# Patient Record
Sex: Male | Born: 1935
Health system: Southern US, Community
[De-identification: ages and names within clinical notes are randomized; demographics above are authoritative.]

## PROBLEM LIST (undated history)

## (undated) DIAGNOSIS — M545 Low back pain, unspecified: Secondary | ICD-10-CM

## (undated) DIAGNOSIS — Z8719 Personal history of other diseases of the digestive system: Secondary | ICD-10-CM

## (undated) DIAGNOSIS — G8929 Other chronic pain: Secondary | ICD-10-CM

## (undated) DIAGNOSIS — G309 Alzheimer's disease, unspecified: Secondary | ICD-10-CM

## (undated) DIAGNOSIS — M1990 Unspecified osteoarthritis, unspecified site: Secondary | ICD-10-CM

## (undated) DIAGNOSIS — F0390 Unspecified dementia without behavioral disturbance: Secondary | ICD-10-CM

## (undated) DIAGNOSIS — N400 Enlarged prostate without lower urinary tract symptoms: Secondary | ICD-10-CM

## (undated) DIAGNOSIS — E079 Disorder of thyroid, unspecified: Secondary | ICD-10-CM

## (undated) DIAGNOSIS — F0280 Dementia in other diseases classified elsewhere without behavioral disturbance: Secondary | ICD-10-CM

## (undated) DIAGNOSIS — N200 Calculus of kidney: Secondary | ICD-10-CM

## (undated) DIAGNOSIS — R55 Syncope and collapse: Secondary | ICD-10-CM

## (undated) DIAGNOSIS — I10 Essential (primary) hypertension: Secondary | ICD-10-CM

## (undated) DIAGNOSIS — H409 Unspecified glaucoma: Secondary | ICD-10-CM

## (undated) HISTORY — PX: LUMBAR DISC SURGERY: SHX700

## (undated) HISTORY — DX: Disorder of thyroid, unspecified: E07.9

## (undated) HISTORY — DX: Dementia in other diseases classified elsewhere, unspecified severity, without behavioral disturbance, psychotic disturbance, mood disturbance, and anxiety: F02.80

## (undated) HISTORY — DX: Alzheimer's disease, unspecified: G30.9

## (undated) HISTORY — PX: BACK SURGERY: SHX140

## (undated) HISTORY — PX: INGUINAL HERNIA REPAIR: SUR1180

## (undated) HISTORY — PX: CATARACT EXTRACTION W/ INTRAOCULAR LENS  IMPLANT, BILATERAL: SHX1307

## (undated) HISTORY — PX: HIP SURGERY: SHX245

## (undated) HISTORY — PX: COCHLEAR IMPLANT: SUR684

---
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
## 1959-03-03 DIAGNOSIS — Z8711 Personal history of peptic ulcer disease: Secondary | ICD-10-CM

## 1959-03-03 HISTORY — DX: Personal history of peptic ulcer disease: Z87.11

## 2007-04-23 ENCOUNTER — Ambulatory Visit (HOSPITAL_COMMUNITY): Admission: RE | Admit: 2007-04-23 | Discharge: 2007-04-23 | Payer: Self-pay | Admitting: Urology

## 2007-04-23 HISTORY — PX: EXTRACORPOREAL SHOCK WAVE LITHOTRIPSY: SHX1557

## 2007-04-24 ENCOUNTER — Ambulatory Visit (HOSPITAL_COMMUNITY): Admission: RE | Admit: 2007-04-24 | Discharge: 2007-04-24 | Payer: Self-pay | Admitting: Urology

## 2010-06-30 ENCOUNTER — Encounter
Admission: RE | Admit: 2010-06-30 | Discharge: 2010-06-30 | Payer: Self-pay | Source: Home / Self Care | Attending: Orthopedic Surgery | Admitting: Orthopedic Surgery

## 2010-11-14 NOTE — H&P (Signed)
David Valenzuela, David Valenzuela               ACCOUNT NO.:  [id number]   MEDICAL RECORD NO.:  [id number]          PATIENT TYPE:  AMB   LOCATION:  DAY                           FACILITY:  [facility]   PHYSICIAN:  Dennie Maizes, M.D.   DATE OF BIRTH:  16-May-1926   DATE OF ADMISSION:  DATE OF DISCHARGE:  LH                              HISTORY & PHYSICAL   He is coming to the short-stay center at [facility] on April 23, 2007.   CHIEF COMPLAINT:  Intermittent right lower quadrant abdominal pain,  right distal ureteral calculus with obstruction.   HISTORY OF PRESENT ILLNESS:  This 77 year old male who was evaluated for  intermittent right lower quadrant abdominal pain. CT scan of the abdomen  and pelvis revealed a 6-mm size right distal ureteral calculus with  obstruction and hydronephrosis. There was another 5-mm size  nonobstructing left renal calculus. The patient is unable to pass the  stone. He was brought to the short-stay center today for extracorporeal  shockwave lithotripsy of the right distal ureteral calculus.   The patient has benign prostatic hypertrophy with bladder neck  obstruction. He has been taking Flomax with good results. He has urinary  frequency x4 to 5 and nocturia x0. He has good urinary flow. He also has  a history of elevated PSA. Multiple prostate biopsies in the past  revealed no evidence of malignancy. The patient did not have any fevers,  chills, voiding difficulty or gross hematuria at present.   PAST MEDICAL HISTORY:  1. History of hypertension.  2. Elevated cholesterol.  3. Hypothyroidism.  4. Glaucoma.   MEDICATIONS:  Lipitor, Flomax, blood pressure medicine, aspirin and  Synthroid. Aspirin has been stopped for the lithotripsy.   ALLERGIES:  None.   EXAMINATION:  HEENT:  Normal.  NECK:  No masses.  LUNGS:  Clear to auscultation.  HEART:  Regular rate and rhythm. No murmurs.  ABDOMEN:  Soft. No palpable flank masses or CVA tenderness. Ventral  hernia is noted.  Bladder neck palpable. Penis and testes are normal.   IMPRESSION:  Right distal ureteral calculus with obstruction, right  lower quadrant abdominal pain, right hydronephrosis.   PLAN:  Extracorporeal shockwave lithotripsy with right distal ureteral  calculus with IV sedation in the short-stay center. I discussed with the  patient regarding the diagnosis, operative details, alternative  treatment, outcome, possible risks and complications. He has agreed for  the procedure to be done.      Dennie Maizes, M.D.  Electronically Signed     SK/MEDQ  D:  04/23/2007  T:  04/23/2007  Job:  [id number]   cc:   [facility]  Fax: [phone]

## 2011-04-11 LAB — POTASSIUM: Potassium: 4.4

## 2011-07-12 DIAGNOSIS — H905 Unspecified sensorineural hearing loss: Secondary | ICD-10-CM | POA: Diagnosis not present

## 2011-07-17 DIAGNOSIS — I10 Essential (primary) hypertension: Secondary | ICD-10-CM | POA: Diagnosis not present

## 2011-07-17 DIAGNOSIS — E7800 Pure hypercholesterolemia, unspecified: Secondary | ICD-10-CM | POA: Diagnosis not present

## 2011-07-17 DIAGNOSIS — E039 Hypothyroidism, unspecified: Secondary | ICD-10-CM | POA: Diagnosis not present

## 2011-07-18 DIAGNOSIS — H903 Sensorineural hearing loss, bilateral: Secondary | ICD-10-CM | POA: Diagnosis not present

## 2011-07-27 DIAGNOSIS — E039 Hypothyroidism, unspecified: Secondary | ICD-10-CM | POA: Diagnosis not present

## 2011-07-27 DIAGNOSIS — N400 Enlarged prostate without lower urinary tract symptoms: Secondary | ICD-10-CM | POA: Diagnosis not present

## 2011-07-27 DIAGNOSIS — R42 Dizziness and giddiness: Secondary | ICD-10-CM | POA: Diagnosis not present

## 2011-07-27 DIAGNOSIS — M1990 Unspecified osteoarthritis, unspecified site: Secondary | ICD-10-CM | POA: Diagnosis not present

## 2011-07-27 DIAGNOSIS — I10 Essential (primary) hypertension: Secondary | ICD-10-CM | POA: Diagnosis not present

## 2011-07-27 DIAGNOSIS — H913 Deaf nonspeaking, not elsewhere classified: Secondary | ICD-10-CM | POA: Diagnosis not present

## 2011-07-27 DIAGNOSIS — M6281 Muscle weakness (generalized): Secondary | ICD-10-CM | POA: Diagnosis not present

## 2011-07-27 DIAGNOSIS — E7800 Pure hypercholesterolemia, unspecified: Secondary | ICD-10-CM | POA: Diagnosis not present

## 2011-08-02 DIAGNOSIS — R269 Unspecified abnormalities of gait and mobility: Secondary | ICD-10-CM | POA: Diagnosis not present

## 2011-08-02 DIAGNOSIS — M6281 Muscle weakness (generalized): Secondary | ICD-10-CM | POA: Diagnosis not present

## 2011-08-02 DIAGNOSIS — IMO0001 Reserved for inherently not codable concepts without codable children: Secondary | ICD-10-CM | POA: Diagnosis not present

## 2011-08-07 DIAGNOSIS — M6281 Muscle weakness (generalized): Secondary | ICD-10-CM | POA: Diagnosis not present

## 2011-08-07 DIAGNOSIS — R269 Unspecified abnormalities of gait and mobility: Secondary | ICD-10-CM | POA: Diagnosis not present

## 2011-08-07 DIAGNOSIS — R42 Dizziness and giddiness: Secondary | ICD-10-CM | POA: Diagnosis not present

## 2011-08-07 DIAGNOSIS — IMO0001 Reserved for inherently not codable concepts without codable children: Secondary | ICD-10-CM | POA: Diagnosis not present

## 2011-08-10 DIAGNOSIS — M6281 Muscle weakness (generalized): Secondary | ICD-10-CM | POA: Diagnosis not present

## 2011-08-10 DIAGNOSIS — R42 Dizziness and giddiness: Secondary | ICD-10-CM | POA: Diagnosis not present

## 2011-08-10 DIAGNOSIS — IMO0001 Reserved for inherently not codable concepts without codable children: Secondary | ICD-10-CM | POA: Diagnosis not present

## 2011-08-10 DIAGNOSIS — R269 Unspecified abnormalities of gait and mobility: Secondary | ICD-10-CM | POA: Diagnosis not present

## 2011-08-14 DIAGNOSIS — M6281 Muscle weakness (generalized): Secondary | ICD-10-CM | POA: Diagnosis not present

## 2011-08-14 DIAGNOSIS — R269 Unspecified abnormalities of gait and mobility: Secondary | ICD-10-CM | POA: Diagnosis not present

## 2011-08-14 DIAGNOSIS — IMO0001 Reserved for inherently not codable concepts without codable children: Secondary | ICD-10-CM | POA: Diagnosis not present

## 2011-08-14 DIAGNOSIS — R42 Dizziness and giddiness: Secondary | ICD-10-CM | POA: Diagnosis not present

## 2011-08-17 DIAGNOSIS — IMO0001 Reserved for inherently not codable concepts without codable children: Secondary | ICD-10-CM | POA: Diagnosis not present

## 2011-08-17 DIAGNOSIS — M6281 Muscle weakness (generalized): Secondary | ICD-10-CM | POA: Diagnosis not present

## 2011-08-17 DIAGNOSIS — R42 Dizziness and giddiness: Secondary | ICD-10-CM | POA: Diagnosis not present

## 2011-08-17 DIAGNOSIS — R269 Unspecified abnormalities of gait and mobility: Secondary | ICD-10-CM | POA: Diagnosis not present

## 2011-08-21 DIAGNOSIS — R269 Unspecified abnormalities of gait and mobility: Secondary | ICD-10-CM | POA: Diagnosis not present

## 2011-08-21 DIAGNOSIS — R42 Dizziness and giddiness: Secondary | ICD-10-CM | POA: Diagnosis not present

## 2011-08-21 DIAGNOSIS — IMO0001 Reserved for inherently not codable concepts without codable children: Secondary | ICD-10-CM | POA: Diagnosis not present

## 2011-08-21 DIAGNOSIS — M6281 Muscle weakness (generalized): Secondary | ICD-10-CM | POA: Diagnosis not present

## 2011-08-22 DIAGNOSIS — H903 Sensorineural hearing loss, bilateral: Secondary | ICD-10-CM | POA: Diagnosis not present

## 2011-08-22 DIAGNOSIS — Z9689 Presence of other specified functional implants: Secondary | ICD-10-CM | POA: Diagnosis not present

## 2011-08-22 DIAGNOSIS — Z462 Encounter for fitting and adjustment of other devices related to nervous system and special senses: Secondary | ICD-10-CM | POA: Diagnosis not present

## 2011-08-23 DIAGNOSIS — M6281 Muscle weakness (generalized): Secondary | ICD-10-CM | POA: Diagnosis not present

## 2011-08-23 DIAGNOSIS — IMO0001 Reserved for inherently not codable concepts without codable children: Secondary | ICD-10-CM | POA: Diagnosis not present

## 2011-08-23 DIAGNOSIS — R42 Dizziness and giddiness: Secondary | ICD-10-CM | POA: Diagnosis not present

## 2011-08-23 DIAGNOSIS — R269 Unspecified abnormalities of gait and mobility: Secondary | ICD-10-CM | POA: Diagnosis not present

## 2011-08-27 DIAGNOSIS — M6281 Muscle weakness (generalized): Secondary | ICD-10-CM | POA: Diagnosis not present

## 2011-08-27 DIAGNOSIS — R42 Dizziness and giddiness: Secondary | ICD-10-CM | POA: Diagnosis not present

## 2011-08-27 DIAGNOSIS — IMO0001 Reserved for inherently not codable concepts without codable children: Secondary | ICD-10-CM | POA: Diagnosis not present

## 2011-08-27 DIAGNOSIS — R269 Unspecified abnormalities of gait and mobility: Secondary | ICD-10-CM | POA: Diagnosis not present

## 2011-09-06 DIAGNOSIS — H903 Sensorineural hearing loss, bilateral: Secondary | ICD-10-CM | POA: Diagnosis not present

## 2011-09-11 DIAGNOSIS — R42 Dizziness and giddiness: Secondary | ICD-10-CM | POA: Diagnosis not present

## 2011-09-11 DIAGNOSIS — I10 Essential (primary) hypertension: Secondary | ICD-10-CM | POA: Diagnosis not present

## 2011-09-21 DIAGNOSIS — H9190 Unspecified hearing loss, unspecified ear: Secondary | ICD-10-CM | POA: Diagnosis not present

## 2011-09-21 DIAGNOSIS — E785 Hyperlipidemia, unspecified: Secondary | ICD-10-CM | POA: Diagnosis not present

## 2011-09-21 DIAGNOSIS — I10 Essential (primary) hypertension: Secondary | ICD-10-CM | POA: Diagnosis not present

## 2011-09-21 DIAGNOSIS — E039 Hypothyroidism, unspecified: Secondary | ICD-10-CM | POA: Diagnosis not present

## 2011-10-02 ENCOUNTER — Other Ambulatory Visit: Payer: Self-pay | Admitting: Neurology

## 2011-10-02 DIAGNOSIS — R269 Unspecified abnormalities of gait and mobility: Secondary | ICD-10-CM

## 2011-10-02 DIAGNOSIS — H9190 Unspecified hearing loss, unspecified ear: Secondary | ICD-10-CM

## 2011-10-04 ENCOUNTER — Other Ambulatory Visit: Payer: Self-pay | Admitting: Neurology

## 2011-10-04 DIAGNOSIS — H40019 Open angle with borderline findings, low risk, unspecified eye: Secondary | ICD-10-CM | POA: Diagnosis not present

## 2011-10-04 DIAGNOSIS — H353190 Nonexudative age-related macular degeneration, unspecified eye, stage unspecified: Secondary | ICD-10-CM | POA: Diagnosis not present

## 2011-10-04 DIAGNOSIS — R269 Unspecified abnormalities of gait and mobility: Secondary | ICD-10-CM | POA: Diagnosis not present

## 2011-10-04 DIAGNOSIS — H9190 Unspecified hearing loss, unspecified ear: Secondary | ICD-10-CM | POA: Diagnosis not present

## 2011-10-04 LAB — CREATININE, SERUM: Creat: 1.4 mg/dL — ABNORMAL HIGH (ref 0.50–1.35)

## 2011-10-05 ENCOUNTER — Other Ambulatory Visit: Payer: Self-pay

## 2011-10-05 ENCOUNTER — Ambulatory Visit
Admission: RE | Admit: 2011-10-05 | Discharge: 2011-10-05 | Disposition: A | Discharge status: Home / Self Care | Payer: Medicare Other | Source: Ambulatory Visit | Attending: Neurology | Admitting: Neurology

## 2011-10-05 DIAGNOSIS — R269 Unspecified abnormalities of gait and mobility: Secondary | ICD-10-CM

## 2011-10-05 DIAGNOSIS — H9190 Unspecified hearing loss, unspecified ear: Secondary | ICD-10-CM

## 2011-10-05 DIAGNOSIS — I700 Atherosclerosis of aorta: Secondary | ICD-10-CM | POA: Diagnosis not present

## 2011-10-05 MED ORDER — IOHEXOL 350 MG/ML SOLN
100.0000 mL | Freq: Once | INTRAVENOUS | Status: AC | PRN
  Administered 2011-10-05: 100 mL via INTRAVENOUS

## 2011-10-10 DIAGNOSIS — H905 Unspecified sensorineural hearing loss: Secondary | ICD-10-CM | POA: Diagnosis not present

## 2011-10-31 DIAGNOSIS — K5909 Other constipation: Secondary | ICD-10-CM | POA: Diagnosis not present

## 2011-10-31 DIAGNOSIS — R42 Dizziness and giddiness: Secondary | ICD-10-CM | POA: Diagnosis not present

## 2011-11-01 DIAGNOSIS — E785 Hyperlipidemia, unspecified: Secondary | ICD-10-CM | POA: Diagnosis not present

## 2011-11-01 DIAGNOSIS — I10 Essential (primary) hypertension: Secondary | ICD-10-CM | POA: Diagnosis not present

## 2011-11-01 DIAGNOSIS — R269 Unspecified abnormalities of gait and mobility: Secondary | ICD-10-CM | POA: Diagnosis not present

## 2011-11-01 DIAGNOSIS — H9190 Unspecified hearing loss, unspecified ear: Secondary | ICD-10-CM | POA: Diagnosis not present

## 2011-11-13 DIAGNOSIS — E7800 Pure hypercholesterolemia, unspecified: Secondary | ICD-10-CM | POA: Diagnosis not present

## 2011-11-13 DIAGNOSIS — R269 Unspecified abnormalities of gait and mobility: Secondary | ICD-10-CM | POA: Diagnosis not present

## 2011-11-13 DIAGNOSIS — E785 Hyperlipidemia, unspecified: Secondary | ICD-10-CM | POA: Diagnosis not present

## 2011-11-13 DIAGNOSIS — I10 Essential (primary) hypertension: Secondary | ICD-10-CM | POA: Diagnosis not present

## 2011-11-13 DIAGNOSIS — IMO0001 Reserved for inherently not codable concepts without codable children: Secondary | ICD-10-CM | POA: Diagnosis not present

## 2011-11-13 DIAGNOSIS — R42 Dizziness and giddiness: Secondary | ICD-10-CM | POA: Diagnosis not present

## 2011-11-13 DIAGNOSIS — H9190 Unspecified hearing loss, unspecified ear: Secondary | ICD-10-CM | POA: Diagnosis not present

## 2011-11-13 DIAGNOSIS — E039 Hypothyroidism, unspecified: Secondary | ICD-10-CM | POA: Diagnosis not present

## 2011-11-15 DIAGNOSIS — H9190 Unspecified hearing loss, unspecified ear: Secondary | ICD-10-CM | POA: Diagnosis not present

## 2011-11-15 DIAGNOSIS — IMO0001 Reserved for inherently not codable concepts without codable children: Secondary | ICD-10-CM | POA: Diagnosis not present

## 2011-11-15 DIAGNOSIS — R269 Unspecified abnormalities of gait and mobility: Secondary | ICD-10-CM | POA: Diagnosis not present

## 2011-11-15 DIAGNOSIS — E785 Hyperlipidemia, unspecified: Secondary | ICD-10-CM | POA: Diagnosis not present

## 2011-11-15 DIAGNOSIS — E039 Hypothyroidism, unspecified: Secondary | ICD-10-CM | POA: Diagnosis not present

## 2011-11-15 DIAGNOSIS — I10 Essential (primary) hypertension: Secondary | ICD-10-CM | POA: Diagnosis not present

## 2011-11-19 DIAGNOSIS — I10 Essential (primary) hypertension: Secondary | ICD-10-CM | POA: Diagnosis not present

## 2011-11-19 DIAGNOSIS — E039 Hypothyroidism, unspecified: Secondary | ICD-10-CM | POA: Diagnosis not present

## 2011-11-19 DIAGNOSIS — E7800 Pure hypercholesterolemia, unspecified: Secondary | ICD-10-CM | POA: Diagnosis not present

## 2011-11-19 DIAGNOSIS — R42 Dizziness and giddiness: Secondary | ICD-10-CM | POA: Diagnosis not present

## 2011-11-20 DIAGNOSIS — R269 Unspecified abnormalities of gait and mobility: Secondary | ICD-10-CM | POA: Diagnosis not present

## 2011-11-20 DIAGNOSIS — IMO0001 Reserved for inherently not codable concepts without codable children: Secondary | ICD-10-CM | POA: Diagnosis not present

## 2011-11-20 DIAGNOSIS — H9190 Unspecified hearing loss, unspecified ear: Secondary | ICD-10-CM | POA: Diagnosis not present

## 2011-11-20 DIAGNOSIS — E785 Hyperlipidemia, unspecified: Secondary | ICD-10-CM | POA: Diagnosis not present

## 2011-11-20 DIAGNOSIS — E039 Hypothyroidism, unspecified: Secondary | ICD-10-CM | POA: Diagnosis not present

## 2011-11-20 DIAGNOSIS — I10 Essential (primary) hypertension: Secondary | ICD-10-CM | POA: Diagnosis not present

## 2011-11-22 DIAGNOSIS — IMO0001 Reserved for inherently not codable concepts without codable children: Secondary | ICD-10-CM | POA: Diagnosis not present

## 2011-11-22 DIAGNOSIS — H9190 Unspecified hearing loss, unspecified ear: Secondary | ICD-10-CM | POA: Diagnosis not present

## 2011-11-22 DIAGNOSIS — I10 Essential (primary) hypertension: Secondary | ICD-10-CM | POA: Diagnosis not present

## 2011-11-22 DIAGNOSIS — E039 Hypothyroidism, unspecified: Secondary | ICD-10-CM | POA: Diagnosis not present

## 2011-11-22 DIAGNOSIS — R269 Unspecified abnormalities of gait and mobility: Secondary | ICD-10-CM | POA: Diagnosis not present

## 2011-11-22 DIAGNOSIS — E785 Hyperlipidemia, unspecified: Secondary | ICD-10-CM | POA: Diagnosis not present

## 2011-11-23 DIAGNOSIS — H903 Sensorineural hearing loss, bilateral: Secondary | ICD-10-CM | POA: Diagnosis not present

## 2011-11-27 DIAGNOSIS — E785 Hyperlipidemia, unspecified: Secondary | ICD-10-CM | POA: Diagnosis not present

## 2011-11-27 DIAGNOSIS — IMO0001 Reserved for inherently not codable concepts without codable children: Secondary | ICD-10-CM | POA: Diagnosis not present

## 2011-11-27 DIAGNOSIS — E039 Hypothyroidism, unspecified: Secondary | ICD-10-CM | POA: Diagnosis not present

## 2011-11-27 DIAGNOSIS — R269 Unspecified abnormalities of gait and mobility: Secondary | ICD-10-CM | POA: Diagnosis not present

## 2011-11-27 DIAGNOSIS — H9190 Unspecified hearing loss, unspecified ear: Secondary | ICD-10-CM | POA: Diagnosis not present

## 2011-11-27 DIAGNOSIS — I10 Essential (primary) hypertension: Secondary | ICD-10-CM | POA: Diagnosis not present

## 2011-11-29 DIAGNOSIS — I10 Essential (primary) hypertension: Secondary | ICD-10-CM | POA: Diagnosis not present

## 2011-11-29 DIAGNOSIS — E039 Hypothyroidism, unspecified: Secondary | ICD-10-CM | POA: Diagnosis not present

## 2011-11-29 DIAGNOSIS — E785 Hyperlipidemia, unspecified: Secondary | ICD-10-CM | POA: Diagnosis not present

## 2011-11-29 DIAGNOSIS — R269 Unspecified abnormalities of gait and mobility: Secondary | ICD-10-CM | POA: Diagnosis not present

## 2011-11-29 DIAGNOSIS — IMO0001 Reserved for inherently not codable concepts without codable children: Secondary | ICD-10-CM | POA: Diagnosis not present

## 2011-11-29 DIAGNOSIS — H9190 Unspecified hearing loss, unspecified ear: Secondary | ICD-10-CM | POA: Diagnosis not present

## 2011-11-30 DIAGNOSIS — M25559 Pain in unspecified hip: Secondary | ICD-10-CM | POA: Diagnosis not present

## 2011-11-30 DIAGNOSIS — M169 Osteoarthritis of hip, unspecified: Secondary | ICD-10-CM | POA: Diagnosis not present

## 2011-12-03 DIAGNOSIS — R42 Dizziness and giddiness: Secondary | ICD-10-CM | POA: Diagnosis not present

## 2011-12-03 DIAGNOSIS — IMO0001 Reserved for inherently not codable concepts without codable children: Secondary | ICD-10-CM | POA: Diagnosis not present

## 2011-12-03 DIAGNOSIS — R269 Unspecified abnormalities of gait and mobility: Secondary | ICD-10-CM | POA: Diagnosis not present

## 2011-12-04 DIAGNOSIS — R972 Elevated prostate specific antigen [PSA]: Secondary | ICD-10-CM | POA: Diagnosis not present

## 2011-12-11 DIAGNOSIS — N400 Enlarged prostate without lower urinary tract symptoms: Secondary | ICD-10-CM | POA: Diagnosis not present

## 2011-12-12 DIAGNOSIS — Z462 Encounter for fitting and adjustment of other devices related to nervous system and special senses: Secondary | ICD-10-CM | POA: Diagnosis not present

## 2011-12-31 DIAGNOSIS — IMO0001 Reserved for inherently not codable concepts without codable children: Secondary | ICD-10-CM | POA: Diagnosis not present

## 2011-12-31 DIAGNOSIS — R269 Unspecified abnormalities of gait and mobility: Secondary | ICD-10-CM | POA: Diagnosis not present

## 2011-12-31 DIAGNOSIS — R42 Dizziness and giddiness: Secondary | ICD-10-CM | POA: Diagnosis not present

## 2012-01-08 DIAGNOSIS — R269 Unspecified abnormalities of gait and mobility: Secondary | ICD-10-CM | POA: Diagnosis not present

## 2012-01-08 DIAGNOSIS — IMO0001 Reserved for inherently not codable concepts without codable children: Secondary | ICD-10-CM | POA: Diagnosis not present

## 2012-01-08 DIAGNOSIS — R42 Dizziness and giddiness: Secondary | ICD-10-CM | POA: Diagnosis not present

## 2012-01-10 DIAGNOSIS — R42 Dizziness and giddiness: Secondary | ICD-10-CM | POA: Diagnosis not present

## 2012-01-10 DIAGNOSIS — R269 Unspecified abnormalities of gait and mobility: Secondary | ICD-10-CM | POA: Diagnosis not present

## 2012-01-10 DIAGNOSIS — IMO0001 Reserved for inherently not codable concepts without codable children: Secondary | ICD-10-CM | POA: Diagnosis not present

## 2012-01-24 DIAGNOSIS — M5430 Sciatica, unspecified side: Secondary | ICD-10-CM | POA: Diagnosis not present

## 2012-01-24 DIAGNOSIS — C61 Malignant neoplasm of prostate: Secondary | ICD-10-CM | POA: Diagnosis not present

## 2012-01-24 DIAGNOSIS — M1610 Unilateral primary osteoarthritis, unspecified hip: Secondary | ICD-10-CM | POA: Diagnosis not present

## 2012-02-05 DIAGNOSIS — R110 Nausea: Secondary | ICD-10-CM | POA: Diagnosis not present

## 2012-02-05 DIAGNOSIS — R1013 Epigastric pain: Secondary | ICD-10-CM | POA: Diagnosis not present

## 2012-02-05 DIAGNOSIS — R5381 Other malaise: Secondary | ICD-10-CM | POA: Diagnosis not present

## 2012-02-05 DIAGNOSIS — R634 Abnormal weight loss: Secondary | ICD-10-CM | POA: Diagnosis not present

## 2012-02-05 DIAGNOSIS — R05 Cough: Secondary | ICD-10-CM | POA: Diagnosis not present

## 2012-02-05 DIAGNOSIS — M129 Arthropathy, unspecified: Secondary | ICD-10-CM | POA: Diagnosis not present

## 2012-02-05 DIAGNOSIS — J309 Allergic rhinitis, unspecified: Secondary | ICD-10-CM | POA: Diagnosis not present

## 2012-02-05 DIAGNOSIS — R5383 Other fatigue: Secondary | ICD-10-CM | POA: Diagnosis not present

## 2012-02-18 DIAGNOSIS — H903 Sensorineural hearing loss, bilateral: Secondary | ICD-10-CM | POA: Diagnosis not present

## 2012-02-25 DIAGNOSIS — M169 Osteoarthritis of hip, unspecified: Secondary | ICD-10-CM | POA: Diagnosis not present

## 2012-02-25 DIAGNOSIS — M5430 Sciatica, unspecified side: Secondary | ICD-10-CM | POA: Diagnosis not present

## 2012-03-10 DIAGNOSIS — Z23 Encounter for immunization: Secondary | ICD-10-CM | POA: Diagnosis not present

## 2012-03-12 DIAGNOSIS — E039 Hypothyroidism, unspecified: Secondary | ICD-10-CM | POA: Diagnosis not present

## 2012-03-26 DIAGNOSIS — E7800 Pure hypercholesterolemia, unspecified: Secondary | ICD-10-CM | POA: Diagnosis not present

## 2012-03-26 DIAGNOSIS — I10 Essential (primary) hypertension: Secondary | ICD-10-CM | POA: Diagnosis not present

## 2012-03-26 DIAGNOSIS — E039 Hypothyroidism, unspecified: Secondary | ICD-10-CM | POA: Diagnosis not present

## 2012-03-27 DIAGNOSIS — H905 Unspecified sensorineural hearing loss: Secondary | ICD-10-CM | POA: Diagnosis not present

## 2012-04-01 DIAGNOSIS — N400 Enlarged prostate without lower urinary tract symptoms: Secondary | ICD-10-CM | POA: Diagnosis not present

## 2012-04-01 DIAGNOSIS — E039 Hypothyroidism, unspecified: Secondary | ICD-10-CM | POA: Diagnosis not present

## 2012-04-01 DIAGNOSIS — E7800 Pure hypercholesterolemia, unspecified: Secondary | ICD-10-CM | POA: Diagnosis not present

## 2012-04-01 DIAGNOSIS — G309 Alzheimer's disease, unspecified: Secondary | ICD-10-CM | POA: Diagnosis not present

## 2012-04-01 DIAGNOSIS — I10 Essential (primary) hypertension: Secondary | ICD-10-CM | POA: Diagnosis not present

## 2012-04-14 DIAGNOSIS — M48061 Spinal stenosis, lumbar region without neurogenic claudication: Secondary | ICD-10-CM | POA: Diagnosis not present

## 2012-04-14 DIAGNOSIS — M169 Osteoarthritis of hip, unspecified: Secondary | ICD-10-CM | POA: Diagnosis not present

## 2012-04-14 DIAGNOSIS — M47817 Spondylosis without myelopathy or radiculopathy, lumbosacral region: Secondary | ICD-10-CM | POA: Diagnosis not present

## 2012-04-14 DIAGNOSIS — M5137 Other intervertebral disc degeneration, lumbosacral region: Secondary | ICD-10-CM | POA: Diagnosis not present

## 2012-04-17 DIAGNOSIS — H905 Unspecified sensorineural hearing loss: Secondary | ICD-10-CM | POA: Diagnosis not present

## 2012-04-17 DIAGNOSIS — Z462 Encounter for fitting and adjustment of other devices related to nervous system and special senses: Secondary | ICD-10-CM | POA: Diagnosis not present

## 2012-04-17 DIAGNOSIS — Z9689 Presence of other specified functional implants: Secondary | ICD-10-CM | POA: Diagnosis not present

## 2012-04-29 DIAGNOSIS — Z7982 Long term (current) use of aspirin: Secondary | ICD-10-CM | POA: Diagnosis not present

## 2012-04-29 DIAGNOSIS — Z79899 Other long term (current) drug therapy: Secondary | ICD-10-CM | POA: Diagnosis not present

## 2012-04-29 DIAGNOSIS — I10 Essential (primary) hypertension: Secondary | ICD-10-CM | POA: Diagnosis not present

## 2012-04-29 DIAGNOSIS — M169 Osteoarthritis of hip, unspecified: Secondary | ICD-10-CM | POA: Diagnosis not present

## 2012-04-29 DIAGNOSIS — F0390 Unspecified dementia without behavioral disturbance: Secondary | ICD-10-CM | POA: Diagnosis not present

## 2012-04-29 DIAGNOSIS — M5137 Other intervertebral disc degeneration, lumbosacral region: Secondary | ICD-10-CM | POA: Diagnosis not present

## 2012-04-29 DIAGNOSIS — E039 Hypothyroidism, unspecified: Secondary | ICD-10-CM | POA: Diagnosis not present

## 2012-04-29 DIAGNOSIS — E785 Hyperlipidemia, unspecified: Secondary | ICD-10-CM | POA: Diagnosis not present

## 2012-04-29 DIAGNOSIS — N400 Enlarged prostate without lower urinary tract symptoms: Secondary | ICD-10-CM | POA: Diagnosis not present

## 2012-05-14 DIAGNOSIS — H903 Sensorineural hearing loss, bilateral: Secondary | ICD-10-CM | POA: Diagnosis not present

## 2012-05-20 DIAGNOSIS — M169 Osteoarthritis of hip, unspecified: Secondary | ICD-10-CM | POA: Diagnosis not present

## 2012-05-20 DIAGNOSIS — M48061 Spinal stenosis, lumbar region without neurogenic claudication: Secondary | ICD-10-CM | POA: Diagnosis not present

## 2012-05-20 DIAGNOSIS — M47817 Spondylosis without myelopathy or radiculopathy, lumbosacral region: Secondary | ICD-10-CM | POA: Diagnosis not present

## 2012-05-20 DIAGNOSIS — M5137 Other intervertebral disc degeneration, lumbosacral region: Secondary | ICD-10-CM | POA: Diagnosis not present

## 2012-05-22 DIAGNOSIS — Z7982 Long term (current) use of aspirin: Secondary | ICD-10-CM | POA: Diagnosis not present

## 2012-05-22 DIAGNOSIS — I10 Essential (primary) hypertension: Secondary | ICD-10-CM | POA: Diagnosis not present

## 2012-05-22 DIAGNOSIS — Z79899 Other long term (current) drug therapy: Secondary | ICD-10-CM | POA: Diagnosis not present

## 2012-05-22 DIAGNOSIS — M5137 Other intervertebral disc degeneration, lumbosacral region: Secondary | ICD-10-CM | POA: Diagnosis not present

## 2012-05-22 DIAGNOSIS — M169 Osteoarthritis of hip, unspecified: Secondary | ICD-10-CM | POA: Diagnosis not present

## 2012-05-22 DIAGNOSIS — N400 Enlarged prostate without lower urinary tract symptoms: Secondary | ICD-10-CM | POA: Diagnosis not present

## 2012-05-22 DIAGNOSIS — E785 Hyperlipidemia, unspecified: Secondary | ICD-10-CM | POA: Diagnosis not present

## 2012-05-22 DIAGNOSIS — E039 Hypothyroidism, unspecified: Secondary | ICD-10-CM | POA: Diagnosis not present

## 2012-05-22 DIAGNOSIS — M51379 Other intervertebral disc degeneration, lumbosacral region without mention of lumbar back pain or lower extremity pain: Secondary | ICD-10-CM | POA: Diagnosis not present

## 2012-05-22 DIAGNOSIS — F0390 Unspecified dementia without behavioral disturbance: Secondary | ICD-10-CM | POA: Diagnosis not present

## 2012-05-22 DIAGNOSIS — M47817 Spondylosis without myelopathy or radiculopathy, lumbosacral region: Secondary | ICD-10-CM | POA: Diagnosis not present

## 2012-05-23 DIAGNOSIS — M47817 Spondylosis without myelopathy or radiculopathy, lumbosacral region: Secondary | ICD-10-CM | POA: Diagnosis not present

## 2012-05-23 DIAGNOSIS — M48061 Spinal stenosis, lumbar region without neurogenic claudication: Secondary | ICD-10-CM | POA: Diagnosis not present

## 2012-05-23 DIAGNOSIS — M5137 Other intervertebral disc degeneration, lumbosacral region: Secondary | ICD-10-CM | POA: Diagnosis not present

## 2012-06-04 DIAGNOSIS — R972 Elevated prostate specific antigen [PSA]: Secondary | ICD-10-CM | POA: Diagnosis not present

## 2012-06-11 DIAGNOSIS — N400 Enlarged prostate without lower urinary tract symptoms: Secondary | ICD-10-CM | POA: Diagnosis not present

## 2012-06-26 DIAGNOSIS — M5380 Other specified dorsopathies, site unspecified: Secondary | ICD-10-CM | POA: Diagnosis not present

## 2012-06-26 DIAGNOSIS — M5137 Other intervertebral disc degeneration, lumbosacral region: Secondary | ICD-10-CM | POA: Diagnosis not present

## 2012-06-26 DIAGNOSIS — M48061 Spinal stenosis, lumbar region without neurogenic claudication: Secondary | ICD-10-CM | POA: Diagnosis not present

## 2012-06-26 DIAGNOSIS — M47817 Spondylosis without myelopathy or radiculopathy, lumbosacral region: Secondary | ICD-10-CM | POA: Diagnosis not present

## 2012-07-08 DIAGNOSIS — I10 Essential (primary) hypertension: Secondary | ICD-10-CM | POA: Diagnosis not present

## 2012-07-08 DIAGNOSIS — E7800 Pure hypercholesterolemia, unspecified: Secondary | ICD-10-CM | POA: Diagnosis not present

## 2012-07-14 DIAGNOSIS — E039 Hypothyroidism, unspecified: Secondary | ICD-10-CM | POA: Diagnosis not present

## 2012-07-14 DIAGNOSIS — M1990 Unspecified osteoarthritis, unspecified site: Secondary | ICD-10-CM | POA: Diagnosis not present

## 2012-07-14 DIAGNOSIS — N400 Enlarged prostate without lower urinary tract symptoms: Secondary | ICD-10-CM | POA: Diagnosis not present

## 2012-07-14 DIAGNOSIS — H913 Deaf nonspeaking, not elsewhere classified: Secondary | ICD-10-CM | POA: Diagnosis not present

## 2012-07-14 DIAGNOSIS — E7800 Pure hypercholesterolemia, unspecified: Secondary | ICD-10-CM | POA: Diagnosis not present

## 2012-07-14 DIAGNOSIS — I10 Essential (primary) hypertension: Secondary | ICD-10-CM | POA: Diagnosis not present

## 2012-07-14 DIAGNOSIS — R42 Dizziness and giddiness: Secondary | ICD-10-CM | POA: Diagnosis not present

## 2012-07-18 DIAGNOSIS — M169 Osteoarthritis of hip, unspecified: Secondary | ICD-10-CM | POA: Diagnosis not present

## 2012-07-18 DIAGNOSIS — E039 Hypothyroidism, unspecified: Secondary | ICD-10-CM | POA: Diagnosis not present

## 2012-07-18 DIAGNOSIS — M5380 Other specified dorsopathies, site unspecified: Secondary | ICD-10-CM | POA: Diagnosis not present

## 2012-07-18 DIAGNOSIS — I10 Essential (primary) hypertension: Secondary | ICD-10-CM | POA: Diagnosis not present

## 2012-07-18 DIAGNOSIS — N400 Enlarged prostate without lower urinary tract symptoms: Secondary | ICD-10-CM | POA: Diagnosis not present

## 2012-07-18 DIAGNOSIS — F0390 Unspecified dementia without behavioral disturbance: Secondary | ICD-10-CM | POA: Diagnosis not present

## 2012-07-18 DIAGNOSIS — M5137 Other intervertebral disc degeneration, lumbosacral region: Secondary | ICD-10-CM | POA: Diagnosis not present

## 2012-07-18 DIAGNOSIS — E785 Hyperlipidemia, unspecified: Secondary | ICD-10-CM | POA: Diagnosis not present

## 2012-07-28 DIAGNOSIS — M47817 Spondylosis without myelopathy or radiculopathy, lumbosacral region: Secondary | ICD-10-CM | POA: Diagnosis not present

## 2012-07-28 DIAGNOSIS — M5137 Other intervertebral disc degeneration, lumbosacral region: Secondary | ICD-10-CM | POA: Diagnosis not present

## 2012-07-28 DIAGNOSIS — M48061 Spinal stenosis, lumbar region without neurogenic claudication: Secondary | ICD-10-CM | POA: Diagnosis not present

## 2012-07-28 DIAGNOSIS — G5700 Lesion of sciatic nerve, unspecified lower limb: Secondary | ICD-10-CM | POA: Diagnosis not present

## 2012-08-01 DIAGNOSIS — E785 Hyperlipidemia, unspecified: Secondary | ICD-10-CM | POA: Diagnosis not present

## 2012-08-01 DIAGNOSIS — M5137 Other intervertebral disc degeneration, lumbosacral region: Secondary | ICD-10-CM | POA: Diagnosis not present

## 2012-08-01 DIAGNOSIS — G5700 Lesion of sciatic nerve, unspecified lower limb: Secondary | ICD-10-CM | POA: Diagnosis not present

## 2012-08-01 DIAGNOSIS — M169 Osteoarthritis of hip, unspecified: Secondary | ICD-10-CM | POA: Diagnosis not present

## 2012-08-01 DIAGNOSIS — Z7982 Long term (current) use of aspirin: Secondary | ICD-10-CM | POA: Diagnosis not present

## 2012-08-01 DIAGNOSIS — Z79899 Other long term (current) drug therapy: Secondary | ICD-10-CM | POA: Diagnosis not present

## 2012-08-01 DIAGNOSIS — I10 Essential (primary) hypertension: Secondary | ICD-10-CM | POA: Diagnosis not present

## 2012-08-01 DIAGNOSIS — F0390 Unspecified dementia without behavioral disturbance: Secondary | ICD-10-CM | POA: Diagnosis not present

## 2012-08-01 DIAGNOSIS — N400 Enlarged prostate without lower urinary tract symptoms: Secondary | ICD-10-CM | POA: Diagnosis not present

## 2012-08-01 DIAGNOSIS — IMO0001 Reserved for inherently not codable concepts without codable children: Secondary | ICD-10-CM | POA: Diagnosis not present

## 2012-08-01 DIAGNOSIS — E039 Hypothyroidism, unspecified: Secondary | ICD-10-CM | POA: Diagnosis not present

## 2012-08-12 DIAGNOSIS — M47817 Spondylosis without myelopathy or radiculopathy, lumbosacral region: Secondary | ICD-10-CM | POA: Diagnosis not present

## 2012-08-12 DIAGNOSIS — M48061 Spinal stenosis, lumbar region without neurogenic claudication: Secondary | ICD-10-CM | POA: Diagnosis not present

## 2012-08-21 DIAGNOSIS — M5380 Other specified dorsopathies, site unspecified: Secondary | ICD-10-CM | POA: Diagnosis not present

## 2012-09-08 DIAGNOSIS — N401 Enlarged prostate with lower urinary tract symptoms: Secondary | ICD-10-CM | POA: Diagnosis not present

## 2012-09-08 DIAGNOSIS — R350 Frequency of micturition: Secondary | ICD-10-CM | POA: Diagnosis not present

## 2012-09-08 DIAGNOSIS — R3915 Urgency of urination: Secondary | ICD-10-CM | POA: Diagnosis not present

## 2012-09-10 DIAGNOSIS — R339 Retention of urine, unspecified: Secondary | ICD-10-CM | POA: Diagnosis not present

## 2012-09-10 DIAGNOSIS — R350 Frequency of micturition: Secondary | ICD-10-CM | POA: Diagnosis not present

## 2012-09-10 DIAGNOSIS — M47817 Spondylosis without myelopathy or radiculopathy, lumbosacral region: Secondary | ICD-10-CM | POA: Diagnosis not present

## 2012-09-10 DIAGNOSIS — N9989 Other postprocedural complications and disorders of genitourinary system: Secondary | ICD-10-CM | POA: Diagnosis not present

## 2012-09-11 DIAGNOSIS — N9989 Other postprocedural complications and disorders of genitourinary system: Secondary | ICD-10-CM | POA: Diagnosis not present

## 2012-09-11 DIAGNOSIS — M4310 Spondylolisthesis, site unspecified: Secondary | ICD-10-CM | POA: Diagnosis not present

## 2012-09-17 DIAGNOSIS — N3943 Post-void dribbling: Secondary | ICD-10-CM | POA: Diagnosis not present

## 2012-09-23 DIAGNOSIS — K5900 Constipation, unspecified: Secondary | ICD-10-CM | POA: Diagnosis not present

## 2012-09-23 DIAGNOSIS — I10 Essential (primary) hypertension: Secondary | ICD-10-CM | POA: Diagnosis not present

## 2012-09-23 DIAGNOSIS — R339 Retention of urine, unspecified: Secondary | ICD-10-CM | POA: Diagnosis not present

## 2012-09-23 DIAGNOSIS — M5430 Sciatica, unspecified side: Secondary | ICD-10-CM | POA: Diagnosis not present

## 2012-09-23 DIAGNOSIS — F0280 Dementia in other diseases classified elsewhere without behavioral disturbance: Secondary | ICD-10-CM | POA: Diagnosis not present

## 2012-09-23 DIAGNOSIS — N400 Enlarged prostate without lower urinary tract symptoms: Secondary | ICD-10-CM | POA: Diagnosis not present

## 2012-09-23 DIAGNOSIS — G309 Alzheimer's disease, unspecified: Secondary | ICD-10-CM | POA: Diagnosis not present

## 2012-10-09 DIAGNOSIS — H353190 Nonexudative age-related macular degeneration, unspecified eye, stage unspecified: Secondary | ICD-10-CM | POA: Diagnosis not present

## 2012-10-15 DIAGNOSIS — Z462 Encounter for fitting and adjustment of other devices related to nervous system and special senses: Secondary | ICD-10-CM | POA: Diagnosis not present

## 2012-10-15 DIAGNOSIS — Z9689 Presence of other specified functional implants: Secondary | ICD-10-CM | POA: Diagnosis not present

## 2012-10-21 DIAGNOSIS — K5900 Constipation, unspecified: Secondary | ICD-10-CM | POA: Diagnosis not present

## 2012-10-21 DIAGNOSIS — R3915 Urgency of urination: Secondary | ICD-10-CM | POA: Diagnosis not present

## 2012-10-21 DIAGNOSIS — N4289 Other specified disorders of prostate: Secondary | ICD-10-CM | POA: Diagnosis not present

## 2012-11-11 DIAGNOSIS — R42 Dizziness and giddiness: Secondary | ICD-10-CM | POA: Diagnosis not present

## 2012-11-11 DIAGNOSIS — M1990 Unspecified osteoarthritis, unspecified site: Secondary | ICD-10-CM | POA: Diagnosis not present

## 2012-11-11 DIAGNOSIS — H913 Deaf nonspeaking, not elsewhere classified: Secondary | ICD-10-CM | POA: Diagnosis not present

## 2012-11-11 DIAGNOSIS — N400 Enlarged prostate without lower urinary tract symptoms: Secondary | ICD-10-CM | POA: Diagnosis not present

## 2012-11-11 DIAGNOSIS — E7800 Pure hypercholesterolemia, unspecified: Secondary | ICD-10-CM | POA: Diagnosis not present

## 2012-11-11 DIAGNOSIS — I10 Essential (primary) hypertension: Secondary | ICD-10-CM | POA: Diagnosis not present

## 2012-11-11 DIAGNOSIS — E039 Hypothyroidism, unspecified: Secondary | ICD-10-CM | POA: Diagnosis not present

## 2012-11-18 DIAGNOSIS — M1990 Unspecified osteoarthritis, unspecified site: Secondary | ICD-10-CM | POA: Diagnosis not present

## 2012-11-18 DIAGNOSIS — N189 Chronic kidney disease, unspecified: Secondary | ICD-10-CM | POA: Diagnosis not present

## 2012-11-18 DIAGNOSIS — E875 Hyperkalemia: Secondary | ICD-10-CM | POA: Diagnosis not present

## 2012-11-18 DIAGNOSIS — I10 Essential (primary) hypertension: Secondary | ICD-10-CM | POA: Diagnosis not present

## 2012-11-18 DIAGNOSIS — H9190 Unspecified hearing loss, unspecified ear: Secondary | ICD-10-CM | POA: Diagnosis not present

## 2012-11-18 DIAGNOSIS — R42 Dizziness and giddiness: Secondary | ICD-10-CM | POA: Diagnosis not present

## 2012-11-18 DIAGNOSIS — E7800 Pure hypercholesterolemia, unspecified: Secondary | ICD-10-CM | POA: Diagnosis not present

## 2012-11-18 DIAGNOSIS — N400 Enlarged prostate without lower urinary tract symptoms: Secondary | ICD-10-CM | POA: Diagnosis not present

## 2012-11-18 DIAGNOSIS — E039 Hypothyroidism, unspecified: Secondary | ICD-10-CM | POA: Diagnosis not present

## 2012-11-19 DIAGNOSIS — N4289 Other specified disorders of prostate: Secondary | ICD-10-CM | POA: Diagnosis not present

## 2012-11-19 DIAGNOSIS — R3915 Urgency of urination: Secondary | ICD-10-CM | POA: Diagnosis not present

## 2012-11-19 DIAGNOSIS — N400 Enlarged prostate without lower urinary tract symptoms: Secondary | ICD-10-CM | POA: Diagnosis not present

## 2012-11-19 DIAGNOSIS — K5900 Constipation, unspecified: Secondary | ICD-10-CM | POA: Diagnosis not present

## 2012-11-20 DIAGNOSIS — K4090 Unilateral inguinal hernia, without obstruction or gangrene, not specified as recurrent: Secondary | ICD-10-CM | POA: Diagnosis not present

## 2012-12-08 DIAGNOSIS — K4090 Unilateral inguinal hernia, without obstruction or gangrene, not specified as recurrent: Secondary | ICD-10-CM | POA: Diagnosis not present

## 2012-12-09 DIAGNOSIS — I10 Essential (primary) hypertension: Secondary | ICD-10-CM | POA: Diagnosis not present

## 2012-12-09 DIAGNOSIS — K4090 Unilateral inguinal hernia, without obstruction or gangrene, not specified as recurrent: Secondary | ICD-10-CM | POA: Diagnosis not present

## 2012-12-09 DIAGNOSIS — G8929 Other chronic pain: Secondary | ICD-10-CM | POA: Diagnosis not present

## 2012-12-09 DIAGNOSIS — Z79899 Other long term (current) drug therapy: Secondary | ICD-10-CM | POA: Diagnosis not present

## 2012-12-09 DIAGNOSIS — E039 Hypothyroidism, unspecified: Secondary | ICD-10-CM | POA: Diagnosis not present

## 2012-12-09 DIAGNOSIS — R350 Frequency of micturition: Secondary | ICD-10-CM | POA: Diagnosis not present

## 2012-12-09 DIAGNOSIS — Z8489 Family history of other specified conditions: Secondary | ICD-10-CM | POA: Diagnosis not present

## 2012-12-09 DIAGNOSIS — Z87442 Personal history of urinary calculi: Secondary | ICD-10-CM | POA: Diagnosis not present

## 2012-12-09 DIAGNOSIS — R413 Other amnesia: Secondary | ICD-10-CM | POA: Diagnosis not present

## 2012-12-11 DIAGNOSIS — Z79899 Other long term (current) drug therapy: Secondary | ICD-10-CM | POA: Diagnosis not present

## 2012-12-11 DIAGNOSIS — I10 Essential (primary) hypertension: Secondary | ICD-10-CM | POA: Diagnosis not present

## 2012-12-11 DIAGNOSIS — Z8489 Family history of other specified conditions: Secondary | ICD-10-CM | POA: Diagnosis not present

## 2012-12-11 DIAGNOSIS — G8929 Other chronic pain: Secondary | ICD-10-CM | POA: Diagnosis not present

## 2012-12-11 DIAGNOSIS — E039 Hypothyroidism, unspecified: Secondary | ICD-10-CM | POA: Diagnosis not present

## 2012-12-11 DIAGNOSIS — R350 Frequency of micturition: Secondary | ICD-10-CM | POA: Diagnosis not present

## 2012-12-11 DIAGNOSIS — K4090 Unilateral inguinal hernia, without obstruction or gangrene, not specified as recurrent: Secondary | ICD-10-CM | POA: Diagnosis not present

## 2012-12-11 DIAGNOSIS — Z87442 Personal history of urinary calculi: Secondary | ICD-10-CM | POA: Diagnosis not present

## 2013-03-16 DIAGNOSIS — E875 Hyperkalemia: Secondary | ICD-10-CM | POA: Diagnosis not present

## 2013-03-16 DIAGNOSIS — I10 Essential (primary) hypertension: Secondary | ICD-10-CM | POA: Diagnosis not present

## 2013-03-16 DIAGNOSIS — E7800 Pure hypercholesterolemia, unspecified: Secondary | ICD-10-CM | POA: Diagnosis not present

## 2013-03-16 DIAGNOSIS — Z23 Encounter for immunization: Secondary | ICD-10-CM | POA: Diagnosis not present

## 2013-03-16 DIAGNOSIS — E039 Hypothyroidism, unspecified: Secondary | ICD-10-CM | POA: Diagnosis not present

## 2013-03-19 DIAGNOSIS — M259 Joint disorder, unspecified: Secondary | ICD-10-CM | POA: Diagnosis not present

## 2013-03-19 DIAGNOSIS — M5137 Other intervertebral disc degeneration, lumbosacral region: Secondary | ICD-10-CM | POA: Diagnosis not present

## 2013-03-25 DIAGNOSIS — R42 Dizziness and giddiness: Secondary | ICD-10-CM | POA: Diagnosis not present

## 2013-03-25 DIAGNOSIS — H9190 Unspecified hearing loss, unspecified ear: Secondary | ICD-10-CM | POA: Diagnosis not present

## 2013-03-25 DIAGNOSIS — N189 Chronic kidney disease, unspecified: Secondary | ICD-10-CM | POA: Diagnosis not present

## 2013-03-25 DIAGNOSIS — M1990 Unspecified osteoarthritis, unspecified site: Secondary | ICD-10-CM | POA: Diagnosis not present

## 2013-03-25 DIAGNOSIS — I10 Essential (primary) hypertension: Secondary | ICD-10-CM | POA: Diagnosis not present

## 2013-03-25 DIAGNOSIS — N400 Enlarged prostate without lower urinary tract symptoms: Secondary | ICD-10-CM | POA: Diagnosis not present

## 2013-03-25 DIAGNOSIS — E039 Hypothyroidism, unspecified: Secondary | ICD-10-CM | POA: Diagnosis not present

## 2013-03-25 DIAGNOSIS — E7800 Pure hypercholesterolemia, unspecified: Secondary | ICD-10-CM | POA: Diagnosis not present

## 2013-04-01 DIAGNOSIS — Z9689 Presence of other specified functional implants: Secondary | ICD-10-CM | POA: Diagnosis not present

## 2013-04-01 DIAGNOSIS — H905 Unspecified sensorineural hearing loss: Secondary | ICD-10-CM | POA: Diagnosis not present

## 2013-04-01 DIAGNOSIS — Z462 Encounter for fitting and adjustment of other devices related to nervous system and special senses: Secondary | ICD-10-CM | POA: Diagnosis not present

## 2013-05-06 DIAGNOSIS — M47817 Spondylosis without myelopathy or radiculopathy, lumbosacral region: Secondary | ICD-10-CM | POA: Diagnosis not present

## 2013-05-06 DIAGNOSIS — M5137 Other intervertebral disc degeneration, lumbosacral region: Secondary | ICD-10-CM | POA: Diagnosis not present

## 2013-05-20 DIAGNOSIS — K4090 Unilateral inguinal hernia, without obstruction or gangrene, not specified as recurrent: Secondary | ICD-10-CM | POA: Diagnosis not present

## 2013-05-20 DIAGNOSIS — K5900 Constipation, unspecified: Secondary | ICD-10-CM | POA: Diagnosis not present

## 2013-05-20 DIAGNOSIS — R3915 Urgency of urination: Secondary | ICD-10-CM | POA: Diagnosis not present

## 2013-05-20 DIAGNOSIS — N400 Enlarged prostate without lower urinary tract symptoms: Secondary | ICD-10-CM | POA: Diagnosis not present

## 2013-06-17 DIAGNOSIS — J309 Allergic rhinitis, unspecified: Secondary | ICD-10-CM | POA: Diagnosis not present

## 2013-06-17 DIAGNOSIS — R42 Dizziness and giddiness: Secondary | ICD-10-CM | POA: Diagnosis not present

## 2013-06-17 DIAGNOSIS — R51 Headache: Secondary | ICD-10-CM | POA: Diagnosis not present

## 2013-07-16 DIAGNOSIS — Z9889 Other specified postprocedural states: Secondary | ICD-10-CM | POA: Diagnosis not present

## 2013-07-16 DIAGNOSIS — Q762 Congenital spondylolisthesis: Secondary | ICD-10-CM | POA: Diagnosis not present

## 2013-07-16 DIAGNOSIS — M5137 Other intervertebral disc degeneration, lumbosacral region: Secondary | ICD-10-CM | POA: Diagnosis not present

## 2013-07-16 DIAGNOSIS — M169 Osteoarthritis of hip, unspecified: Secondary | ICD-10-CM | POA: Diagnosis not present

## 2013-07-16 DIAGNOSIS — M1610 Unilateral primary osteoarthritis, unspecified hip: Secondary | ICD-10-CM | POA: Diagnosis not present

## 2013-07-16 DIAGNOSIS — M47817 Spondylosis without myelopathy or radiculopathy, lumbosacral region: Secondary | ICD-10-CM | POA: Diagnosis not present

## 2013-07-16 DIAGNOSIS — M259 Joint disorder, unspecified: Secondary | ICD-10-CM | POA: Diagnosis not present

## 2013-07-16 DIAGNOSIS — M519 Unspecified thoracic, thoracolumbar and lumbosacral intervertebral disc disorder: Secondary | ICD-10-CM | POA: Diagnosis not present

## 2013-07-17 DIAGNOSIS — M47817 Spondylosis without myelopathy or radiculopathy, lumbosacral region: Secondary | ICD-10-CM | POA: Diagnosis not present

## 2013-07-17 DIAGNOSIS — M5137 Other intervertebral disc degeneration, lumbosacral region: Secondary | ICD-10-CM | POA: Diagnosis not present

## 2013-07-17 DIAGNOSIS — M48061 Spinal stenosis, lumbar region without neurogenic claudication: Secondary | ICD-10-CM | POA: Diagnosis not present

## 2013-07-20 DIAGNOSIS — E875 Hyperkalemia: Secondary | ICD-10-CM | POA: Diagnosis not present

## 2013-07-20 DIAGNOSIS — I10 Essential (primary) hypertension: Secondary | ICD-10-CM | POA: Diagnosis not present

## 2013-07-20 DIAGNOSIS — E7800 Pure hypercholesterolemia, unspecified: Secondary | ICD-10-CM | POA: Diagnosis not present

## 2013-07-20 DIAGNOSIS — K2100 Gastro-esophageal reflux disease with esophagitis, without bleeding: Secondary | ICD-10-CM | POA: Diagnosis not present

## 2013-07-20 DIAGNOSIS — E039 Hypothyroidism, unspecified: Secondary | ICD-10-CM | POA: Diagnosis not present

## 2013-07-21 DIAGNOSIS — Z01818 Encounter for other preprocedural examination: Secondary | ICD-10-CM | POA: Diagnosis not present

## 2013-07-22 DIAGNOSIS — Z01810 Encounter for preprocedural cardiovascular examination: Secondary | ICD-10-CM | POA: Diagnosis not present

## 2013-07-24 DIAGNOSIS — I10 Essential (primary) hypertension: Secondary | ICD-10-CM | POA: Diagnosis not present

## 2013-07-24 DIAGNOSIS — M47817 Spondylosis without myelopathy or radiculopathy, lumbosacral region: Secondary | ICD-10-CM | POA: Diagnosis not present

## 2013-07-24 DIAGNOSIS — E039 Hypothyroidism, unspecified: Secondary | ICD-10-CM | POA: Diagnosis not present

## 2013-07-24 DIAGNOSIS — M539 Dorsopathy, unspecified: Secondary | ICD-10-CM | POA: Diagnosis not present

## 2013-07-24 DIAGNOSIS — E785 Hyperlipidemia, unspecified: Secondary | ICD-10-CM | POA: Diagnosis not present

## 2013-07-24 DIAGNOSIS — N400 Enlarged prostate without lower urinary tract symptoms: Secondary | ICD-10-CM | POA: Diagnosis not present

## 2013-07-24 DIAGNOSIS — K5900 Constipation, unspecified: Secondary | ICD-10-CM | POA: Diagnosis present

## 2013-07-24 DIAGNOSIS — S32009A Unspecified fracture of unspecified lumbar vertebra, initial encounter for closed fracture: Secondary | ICD-10-CM | POA: Diagnosis not present

## 2013-07-24 DIAGNOSIS — F0390 Unspecified dementia without behavioral disturbance: Secondary | ICD-10-CM | POA: Diagnosis not present

## 2013-07-24 DIAGNOSIS — Z79899 Other long term (current) drug therapy: Secondary | ICD-10-CM | POA: Diagnosis not present

## 2013-07-24 DIAGNOSIS — N390 Urinary tract infection, site not specified: Secondary | ICD-10-CM | POA: Diagnosis present

## 2013-07-24 DIAGNOSIS — M4310 Spondylolisthesis, site unspecified: Secondary | ICD-10-CM | POA: Diagnosis not present

## 2013-07-24 DIAGNOSIS — D72829 Elevated white blood cell count, unspecified: Secondary | ICD-10-CM | POA: Diagnosis not present

## 2013-07-24 DIAGNOSIS — E875 Hyperkalemia: Secondary | ICD-10-CM | POA: Diagnosis not present

## 2013-07-24 DIAGNOSIS — M48061 Spinal stenosis, lumbar region without neurogenic claudication: Secondary | ICD-10-CM | POA: Diagnosis not present

## 2013-07-24 DIAGNOSIS — I959 Hypotension, unspecified: Secondary | ICD-10-CM | POA: Diagnosis not present

## 2013-08-05 DIAGNOSIS — M539 Dorsopathy, unspecified: Secondary | ICD-10-CM | POA: Diagnosis not present

## 2013-08-05 DIAGNOSIS — Z981 Arthrodesis status: Secondary | ICD-10-CM | POA: Diagnosis not present

## 2013-08-05 DIAGNOSIS — M5137 Other intervertebral disc degeneration, lumbosacral region: Secondary | ICD-10-CM | POA: Diagnosis not present

## 2013-08-17 DIAGNOSIS — N189 Chronic kidney disease, unspecified: Secondary | ICD-10-CM | POA: Diagnosis not present

## 2013-08-17 DIAGNOSIS — E7800 Pure hypercholesterolemia, unspecified: Secondary | ICD-10-CM | POA: Diagnosis not present

## 2013-08-17 DIAGNOSIS — N400 Enlarged prostate without lower urinary tract symptoms: Secondary | ICD-10-CM | POA: Diagnosis not present

## 2013-08-17 DIAGNOSIS — H9190 Unspecified hearing loss, unspecified ear: Secondary | ICD-10-CM | POA: Diagnosis not present

## 2013-08-17 DIAGNOSIS — R42 Dizziness and giddiness: Secondary | ICD-10-CM | POA: Diagnosis not present

## 2013-08-17 DIAGNOSIS — M1990 Unspecified osteoarthritis, unspecified site: Secondary | ICD-10-CM | POA: Diagnosis not present

## 2013-08-17 DIAGNOSIS — E039 Hypothyroidism, unspecified: Secondary | ICD-10-CM | POA: Diagnosis not present

## 2013-08-17 DIAGNOSIS — I10 Essential (primary) hypertension: Secondary | ICD-10-CM | POA: Diagnosis not present

## 2013-09-16 DIAGNOSIS — H903 Sensorineural hearing loss, bilateral: Secondary | ICD-10-CM | POA: Diagnosis not present

## 2013-10-13 DIAGNOSIS — J40 Bronchitis, not specified as acute or chronic: Secondary | ICD-10-CM | POA: Diagnosis not present

## 2013-10-13 DIAGNOSIS — T84498A Other mechanical complication of other internal orthopedic devices, implants and grafts, initial encounter: Secondary | ICD-10-CM | POA: Diagnosis not present

## 2013-10-13 DIAGNOSIS — M47817 Spondylosis without myelopathy or radiculopathy, lumbosacral region: Secondary | ICD-10-CM | POA: Diagnosis not present

## 2013-10-13 DIAGNOSIS — M48061 Spinal stenosis, lumbar region without neurogenic claudication: Secondary | ICD-10-CM | POA: Diagnosis not present

## 2013-10-13 DIAGNOSIS — J069 Acute upper respiratory infection, unspecified: Secondary | ICD-10-CM | POA: Diagnosis not present

## 2013-10-13 DIAGNOSIS — M519 Unspecified thoracic, thoracolumbar and lumbosacral intervertebral disc disorder: Secondary | ICD-10-CM | POA: Diagnosis not present

## 2013-10-26 DIAGNOSIS — H903 Sensorineural hearing loss, bilateral: Secondary | ICD-10-CM | POA: Diagnosis not present

## 2013-10-26 DIAGNOSIS — Z9689 Presence of other specified functional implants: Secondary | ICD-10-CM | POA: Diagnosis not present

## 2013-10-26 DIAGNOSIS — Z462 Encounter for fitting and adjustment of other devices related to nervous system and special senses: Secondary | ICD-10-CM | POA: Diagnosis not present

## 2013-11-16 DIAGNOSIS — N400 Enlarged prostate without lower urinary tract symptoms: Secondary | ICD-10-CM | POA: Diagnosis not present

## 2013-11-16 DIAGNOSIS — R3915 Urgency of urination: Secondary | ICD-10-CM | POA: Diagnosis not present

## 2013-11-16 DIAGNOSIS — K5900 Constipation, unspecified: Secondary | ICD-10-CM | POA: Diagnosis not present

## 2013-11-16 DIAGNOSIS — K4090 Unilateral inguinal hernia, without obstruction or gangrene, not specified as recurrent: Secondary | ICD-10-CM | POA: Diagnosis not present

## 2013-12-01 DIAGNOSIS — M48061 Spinal stenosis, lumbar region without neurogenic claudication: Secondary | ICD-10-CM | POA: Diagnosis not present

## 2013-12-01 DIAGNOSIS — M5137 Other intervertebral disc degeneration, lumbosacral region: Secondary | ICD-10-CM | POA: Diagnosis not present

## 2013-12-01 DIAGNOSIS — M47817 Spondylosis without myelopathy or radiculopathy, lumbosacral region: Secondary | ICD-10-CM | POA: Diagnosis not present

## 2013-12-08 DIAGNOSIS — H903 Sensorineural hearing loss, bilateral: Secondary | ICD-10-CM | POA: Diagnosis not present

## 2014-01-25 DIAGNOSIS — H903 Sensorineural hearing loss, bilateral: Secondary | ICD-10-CM | POA: Diagnosis not present

## 2014-01-25 DIAGNOSIS — Z462 Encounter for fitting and adjustment of other devices related to nervous system and special senses: Secondary | ICD-10-CM | POA: Diagnosis not present

## 2014-01-25 DIAGNOSIS — Z9689 Presence of other specified functional implants: Secondary | ICD-10-CM | POA: Diagnosis not present

## 2014-02-09 DIAGNOSIS — M47817 Spondylosis without myelopathy or radiculopathy, lumbosacral region: Secondary | ICD-10-CM | POA: Diagnosis not present

## 2014-02-09 DIAGNOSIS — M48061 Spinal stenosis, lumbar region without neurogenic claudication: Secondary | ICD-10-CM | POA: Diagnosis not present

## 2014-02-09 DIAGNOSIS — M5137 Other intervertebral disc degeneration, lumbosacral region: Secondary | ICD-10-CM | POA: Diagnosis not present

## 2014-02-22 DIAGNOSIS — H903 Sensorineural hearing loss, bilateral: Secondary | ICD-10-CM | POA: Diagnosis not present

## 2014-03-04 DIAGNOSIS — Z87891 Personal history of nicotine dependence: Secondary | ICD-10-CM | POA: Insufficient documentation

## 2014-03-04 DIAGNOSIS — I10 Essential (primary) hypertension: Secondary | ICD-10-CM | POA: Diagnosis not present

## 2014-03-04 DIAGNOSIS — Z803 Family history of malignant neoplasm of breast: Secondary | ICD-10-CM | POA: Insufficient documentation

## 2014-03-04 DIAGNOSIS — H9193 Unspecified hearing loss, bilateral: Secondary | ICD-10-CM | POA: Insufficient documentation

## 2014-03-04 DIAGNOSIS — E785 Hyperlipidemia, unspecified: Secondary | ICD-10-CM | POA: Insufficient documentation

## 2014-03-04 DIAGNOSIS — R413 Other amnesia: Secondary | ICD-10-CM | POA: Diagnosis not present

## 2014-03-04 DIAGNOSIS — K5900 Constipation, unspecified: Secondary | ICD-10-CM | POA: Insufficient documentation

## 2014-03-04 DIAGNOSIS — Z9889 Other specified postprocedural states: Secondary | ICD-10-CM | POA: Insufficient documentation

## 2014-03-04 DIAGNOSIS — Z9621 Cochlear implant status: Secondary | ICD-10-CM | POA: Insufficient documentation

## 2014-03-04 DIAGNOSIS — E039 Hypothyroidism, unspecified: Secondary | ICD-10-CM | POA: Diagnosis not present

## 2014-03-04 DIAGNOSIS — E782 Mixed hyperlipidemia: Secondary | ICD-10-CM | POA: Diagnosis not present

## 2014-03-12 DIAGNOSIS — I10 Essential (primary) hypertension: Secondary | ICD-10-CM | POA: Diagnosis not present

## 2014-03-12 DIAGNOSIS — E785 Hyperlipidemia, unspecified: Secondary | ICD-10-CM | POA: Diagnosis not present

## 2014-03-12 DIAGNOSIS — E039 Hypothyroidism, unspecified: Secondary | ICD-10-CM | POA: Diagnosis not present

## 2014-04-07 DIAGNOSIS — Z23 Encounter for immunization: Secondary | ICD-10-CM | POA: Diagnosis not present

## 2014-05-20 DIAGNOSIS — H903 Sensorineural hearing loss, bilateral: Secondary | ICD-10-CM | POA: Diagnosis not present

## 2014-05-20 DIAGNOSIS — M4806 Spinal stenosis, lumbar region: Secondary | ICD-10-CM | POA: Diagnosis not present

## 2014-05-20 DIAGNOSIS — M5136 Other intervertebral disc degeneration, lumbar region: Secondary | ICD-10-CM | POA: Diagnosis not present

## 2014-05-20 DIAGNOSIS — M545 Low back pain: Secondary | ICD-10-CM | POA: Diagnosis not present

## 2014-05-20 DIAGNOSIS — M1612 Unilateral primary osteoarthritis, left hip: Secondary | ICD-10-CM | POA: Diagnosis not present

## 2014-05-20 DIAGNOSIS — Z45321 Encounter for adjustment and management of cochlear device: Secondary | ICD-10-CM | POA: Diagnosis not present

## 2014-05-21 DIAGNOSIS — H903 Sensorineural hearing loss, bilateral: Secondary | ICD-10-CM | POA: Diagnosis not present

## 2014-05-31 DIAGNOSIS — M4806 Spinal stenosis, lumbar region: Secondary | ICD-10-CM | POA: Diagnosis not present

## 2014-06-03 DIAGNOSIS — Z23 Encounter for immunization: Secondary | ICD-10-CM | POA: Diagnosis not present

## 2014-06-03 DIAGNOSIS — I10 Essential (primary) hypertension: Secondary | ICD-10-CM | POA: Diagnosis not present

## 2014-06-03 DIAGNOSIS — R946 Abnormal results of thyroid function studies: Secondary | ICD-10-CM | POA: Diagnosis not present

## 2014-08-20 DIAGNOSIS — H903 Sensorineural hearing loss, bilateral: Secondary | ICD-10-CM | POA: Diagnosis not present

## 2014-09-14 DIAGNOSIS — M6281 Muscle weakness (generalized): Secondary | ICD-10-CM | POA: Diagnosis not present

## 2014-09-14 DIAGNOSIS — R413 Other amnesia: Secondary | ICD-10-CM | POA: Diagnosis not present

## 2014-09-14 DIAGNOSIS — Z9889 Other specified postprocedural states: Secondary | ICD-10-CM | POA: Insufficient documentation

## 2014-10-22 DIAGNOSIS — H903 Sensorineural hearing loss, bilateral: Secondary | ICD-10-CM | POA: Diagnosis not present

## 2014-12-07 DIAGNOSIS — H903 Sensorineural hearing loss, bilateral: Secondary | ICD-10-CM | POA: Diagnosis not present

## 2014-12-09 DIAGNOSIS — R3915 Urgency of urination: Secondary | ICD-10-CM | POA: Diagnosis not present

## 2014-12-09 DIAGNOSIS — R972 Elevated prostate specific antigen [PSA]: Secondary | ICD-10-CM | POA: Diagnosis not present

## 2014-12-09 DIAGNOSIS — N400 Enlarged prostate without lower urinary tract symptoms: Secondary | ICD-10-CM | POA: Diagnosis not present

## 2014-12-21 DIAGNOSIS — R3915 Urgency of urination: Secondary | ICD-10-CM | POA: Diagnosis not present

## 2014-12-21 DIAGNOSIS — N400 Enlarged prostate without lower urinary tract symptoms: Secondary | ICD-10-CM | POA: Diagnosis not present

## 2014-12-22 ENCOUNTER — Inpatient Hospital Stay (HOSPITAL_COMMUNITY)
Admission: EM | Admit: 2014-12-22 | Discharge: 2014-12-24 | DRG: 312 | Disposition: A | Discharge status: Home Health | Payer: Medicare Other | Attending: Internal Medicine | Admitting: Internal Medicine

## 2014-12-22 ENCOUNTER — Encounter (HOSPITAL_COMMUNITY): Payer: Self-pay | Admitting: *Deleted

## 2014-12-22 ENCOUNTER — Emergency Department (HOSPITAL_COMMUNITY): Payer: Medicare Other

## 2014-12-22 DIAGNOSIS — Z79899 Other long term (current) drug therapy: Secondary | ICD-10-CM

## 2014-12-22 DIAGNOSIS — N390 Urinary tract infection, site not specified: Secondary | ICD-10-CM | POA: Diagnosis present

## 2014-12-22 DIAGNOSIS — E876 Hypokalemia: Secondary | ICD-10-CM | POA: Diagnosis present

## 2014-12-22 DIAGNOSIS — R55 Syncope and collapse: Secondary | ICD-10-CM | POA: Diagnosis present

## 2014-12-22 DIAGNOSIS — R404 Transient alteration of awareness: Secondary | ICD-10-CM | POA: Diagnosis not present

## 2014-12-22 DIAGNOSIS — E039 Hypothyroidism, unspecified: Secondary | ICD-10-CM

## 2014-12-22 DIAGNOSIS — R9431 Abnormal electrocardiogram [ECG] [EKG]: Secondary | ICD-10-CM | POA: Diagnosis not present

## 2014-12-22 DIAGNOSIS — I10 Essential (primary) hypertension: Secondary | ICD-10-CM

## 2014-12-22 DIAGNOSIS — N400 Enlarged prostate without lower urinary tract symptoms: Secondary | ICD-10-CM

## 2014-12-22 DIAGNOSIS — F0390 Unspecified dementia without behavioral disturbance: Secondary | ICD-10-CM | POA: Diagnosis not present

## 2014-12-22 DIAGNOSIS — E860 Dehydration: Secondary | ICD-10-CM | POA: Diagnosis present

## 2014-12-22 HISTORY — DX: Unspecified osteoarthritis, unspecified site: M19.90

## 2014-12-22 HISTORY — DX: Essential (primary) hypertension: I10

## 2014-12-22 HISTORY — DX: Personal history of other diseases of the digestive system: Z87.19

## 2014-12-22 HISTORY — DX: Syncope and collapse: R55

## 2014-12-22 HISTORY — DX: Unspecified glaucoma: H40.9

## 2014-12-22 HISTORY — DX: Calculus of kidney: N20.0

## 2014-12-22 HISTORY — DX: Unspecified dementia, unspecified severity, without behavioral disturbance, psychotic disturbance, mood disturbance, and anxiety: F03.90

## 2014-12-22 HISTORY — DX: Low back pain: M54.5

## 2014-12-22 HISTORY — DX: Other chronic pain: G89.29

## 2014-12-22 HISTORY — DX: Benign prostatic hyperplasia without lower urinary tract symptoms: N40.0

## 2014-12-22 HISTORY — DX: Low back pain, unspecified: M54.50

## 2014-12-22 LAB — TSH: TSH: 72.197 u[IU]/mL — AB (ref 0.350–4.500)

## 2014-12-22 LAB — URINALYSIS, ROUTINE W REFLEX MICROSCOPIC
BILIRUBIN URINE: NEGATIVE
Glucose, UA: NEGATIVE mg/dL
Ketones, ur: NEGATIVE mg/dL
NITRITE: POSITIVE — AB
Protein, ur: NEGATIVE mg/dL
SPECIFIC GRAVITY, URINE: 1.011 (ref 1.005–1.030)
UROBILINOGEN UA: 0.2 mg/dL (ref 0.0–1.0)
pH: 6 (ref 5.0–8.0)

## 2014-12-22 LAB — CBC
HCT: 42.2 % (ref 39.0–52.0)
HCT: 39.1 % (ref 39.0–52.0)
Hemoglobin: 14 g/dL (ref 13.0–17.0)
Hemoglobin: 13.2 g/dL (ref 13.0–17.0)
MCH: 31.5 pg (ref 26.0–34.0)
MCH: 32 pg (ref 26.0–34.0)
MCHC: 33.2 g/dL (ref 30.0–36.0)
MCHC: 33.8 g/dL (ref 30.0–36.0)
MCV: 94.8 fL (ref 78.0–100.0)
MCV: 94.7 fL (ref 78.0–100.0)
PLATELETS: 211 10*3/uL (ref 150–400)
PLATELETS: 203 10*3/uL (ref 150–400)
RBC: 4.45 MIL/uL (ref 4.22–5.81)
RBC: 4.13 MIL/uL — ABNORMAL LOW (ref 4.22–5.81)
RDW: 14.6 % (ref 11.5–15.5)
RDW: 14.7 % (ref 11.5–15.5)
WBC: 4.3 10*3/uL (ref 4.0–10.5)
WBC: 14.1 10*3/uL — ABNORMAL HIGH (ref 4.0–10.5)

## 2014-12-22 LAB — CREATININE, SERUM
Creatinine, Ser: 1.85 mg/dL — ABNORMAL HIGH (ref 0.61–1.24)
GFR, EST AFRICAN AMERICAN: 36 mL/min — AB (ref >60)
GFR, EST NON AFRICAN AMERICAN: 31 mL/min — AB (ref >60)

## 2014-12-22 LAB — I-STAT CHEM 8, ED
BUN: 16 mg/dL (ref 6–20)
CALCIUM ION: 1.29 mmol/L (ref 1.13–1.30)
CHLORIDE: 103 mmol/L (ref 101–111)
CREATININE: 1.7 mg/dL — AB (ref 0.61–1.24)
GLUCOSE: 138 mg/dL — AB (ref 65–99)
HCT: 44 % (ref 39.0–52.0)
HEMOGLOBIN: 15 g/dL (ref 13.0–17.0)
POTASSIUM: 3.4 mmol/L — AB (ref 3.5–5.1)
Sodium: 140 mmol/L (ref 135–145)
TCO2: 24 mmol/L (ref 0–100)

## 2014-12-22 LAB — I-STAT TROPONIN, ED: TROPONIN I, POC: 0.01 ng/mL (ref 0.00–0.08)

## 2014-12-22 LAB — URINE MICROSCOPIC-ADD ON

## 2014-12-22 LAB — MAGNESIUM: MAGNESIUM: 1.4 mg/dL — AB (ref 1.7–2.4)

## 2014-12-22 MED ORDER — DONEPEZIL HCL 10 MG PO TABS
20.0000 mg | ORAL_TABLET | Freq: Every day | ORAL | Status: DC
  Administered 2014-12-22 – 2014-12-23 (×2): 20 mg via ORAL
  Filled 2014-12-22 (×3): qty 2

## 2014-12-22 MED ORDER — LISINOPRIL 10 MG PO TABS
10.0000 mg | ORAL_TABLET | Freq: Every day | ORAL | Status: DC
  Administered 2014-12-23: 10 mg via ORAL
  Filled 2014-12-22: qty 1

## 2014-12-22 MED ORDER — CEFTRIAXONE SODIUM IN DEXTROSE 20 MG/ML IV SOLN
1.0000 g | INTRAVENOUS | Status: DC
  Administered 2014-12-22 – 2014-12-23 (×2): 1 g via INTRAVENOUS
  Filled 2014-12-22 (×3): qty 50

## 2014-12-22 MED ORDER — SODIUM CHLORIDE 0.9 % IJ SOLN
3.0000 mL | Freq: Two times a day (BID) | INTRAMUSCULAR | Status: DC
  Administered 2014-12-24: 3 mL via INTRAVENOUS

## 2014-12-22 MED ORDER — SODIUM CHLORIDE 0.9 % IV SOLN
INTRAVENOUS | Status: DC
  Administered 2014-12-22 – 2014-12-23 (×2): via INTRAVENOUS

## 2014-12-22 MED ORDER — ONDANSETRON HCL 4 MG/2ML IJ SOLN: 4.0000 mg | Freq: Three times a day (TID) | INTRAMUSCULAR | Status: DC | PRN

## 2014-12-22 MED ORDER — TAMSULOSIN HCL 0.4 MG PO CAPS
0.4000 mg | ORAL_CAPSULE | Freq: Two times a day (BID) | ORAL | Status: DC
  Administered 2014-12-22 – 2014-12-24 (×4): 0.4 mg via ORAL
  Filled 2014-12-22 (×5): qty 1

## 2014-12-22 MED ORDER — HEPARIN SODIUM (PORCINE) 5000 UNIT/ML IJ SOLN
5000.0000 [IU] | Freq: Three times a day (TID) | INTRAMUSCULAR | Status: DC
  Administered 2014-12-23 – 2014-12-24 (×4): 5000 [IU] via SUBCUTANEOUS
  Filled 2014-12-22 (×7): qty 1

## 2014-12-22 MED ORDER — SODIUM CHLORIDE 0.9 % IV SOLN: INTRAVENOUS | Status: AC

## 2014-12-22 MED ORDER — ACETAMINOPHEN 325 MG PO TABS: 650.0000 mg | ORAL_TABLET | Freq: Four times a day (QID) | ORAL | Status: DC | PRN

## 2014-12-22 MED ORDER — SENNOSIDES-DOCUSATE SODIUM 8.6-50 MG PO TABS
1.0000 | ORAL_TABLET | Freq: Every evening | ORAL | Status: DC | PRN
  Filled 2014-12-22: qty 1

## 2014-12-22 MED ORDER — ACETAMINOPHEN 650 MG RE SUPP: 650.0000 mg | Freq: Four times a day (QID) | RECTAL | Status: DC | PRN

## 2014-12-22 MED ORDER — ONDANSETRON HCL 4 MG/2ML IJ SOLN: 4.0000 mg | Freq: Four times a day (QID) | INTRAMUSCULAR | Status: DC | PRN

## 2014-12-22 MED ORDER — ONDANSETRON HCL 4 MG PO TABS: 4.0000 mg | ORAL_TABLET | Freq: Four times a day (QID) | ORAL | Status: DC | PRN

## 2014-12-22 MED ORDER — MEMANTINE HCL 10 MG PO TABS
10.0000 mg | ORAL_TABLET | Freq: Two times a day (BID) | ORAL | Status: DC
  Administered 2014-12-22 – 2014-12-24 (×4): 10 mg via ORAL
  Filled 2014-12-22 (×5): qty 1

## 2014-12-22 MED ORDER — POTASSIUM CHLORIDE CRYS ER 20 MEQ PO TBCR
40.0000 meq | EXTENDED_RELEASE_TABLET | Freq: Once | ORAL | Status: AC
  Administered 2014-12-22: 40 meq via ORAL
  Filled 2014-12-22: qty 2

## 2014-12-22 NOTE — H&P (Signed)
[facility]          History and Physical    PCP:   David Hilding, MD   EDP: David Valenzuela, M.D.  Chief Complaint:  Syncope 4  HPI: Patient is a very pleasant 81 year old man with history significant for BPH, hypertension and dementia who was at the barber shop today getting a haircut and had what was reported as 3 syncopal events. Wife describes that he was sitting in the chair and he sort of slumped down in the chair and was unresponsive for about 20-30 seconds, this happened 2 more times before they called EMS and it also happened one more time in the ambulance, unfortunately he was not on cardiac monitor at that time. He did lose his bladder function during one of these episodes. Patient does not recall any of these events. Per his wife, there was no rhythmic/seizure-like activity. We have been asked to admit him for further evaluation and management. In the ED, CT scan shows no acute infarct, mild hypokalemia with a potassium of 3.4,urinalysis is positive for UTI. I have independently reviewed his EKG which shows sinus rhythm and flattened T waves throughout.   Allergies:  No Known Allergies    Prior to Admission medications   Medication Sig Start Date End Date Taking? Authorizing Provider  ciprofloxacin (CIPRO) 500 MG tablet Take 500 mg by mouth 2 (two) times daily.   Yes Historical Provider, MD  donepezil (ARICEPT) 10 MG tablet Take 20 mg by mouth at bedtime.   Yes Historical Provider, MD  lisinopril (PRINIVIL,ZESTRIL) 10 MG tablet Take 10 mg by mouth daily.   Yes Historical Provider, MD  meloxicam (MOBIC) 7.5 MG tablet Take 7.5 mg by mouth daily.   Yes Historical Provider, MD  memantine (NAMENDA) 10 MG tablet Take 10 mg by mouth 2 (two) times daily.   Yes Historical Provider, MD  tamsulosin (FLOMAX) 0.4 MG CAPS capsule Take 0.4 mg by mouth 2 (two) times daily.    Historical Provider, MD    Social History:  has no tobacco, alcohol, and drug history on  file.  Family history: Both parents with hypertension  Review of Systems:  Constitutional: Denies fever, chills, diaphoresis, appetite change and fatigue.  HEENT: Denies photophobia, eye pain, redness, hearing loss, ear pain, congestion, sore throat, rhinorrhea, sneezing, mouth sores, trouble swallowing, neck pain, neck stiffness and tinnitus.   Respiratory: Denies SOB, DOE, cough, chest tightness,  and wheezing.   Cardiovascular: Denies chest pain, palpitations and leg swelling.  Gastrointestinal: Denies nausea, vomiting, abdominal pain, diarrhea, constipation, blood in stool and abdominal distention.  Genitourinary: Denies dysuria, urgency, frequency, hematuria, flank pain and difficulty urinating.  Endocrine: Denies: hot or cold intolerance, sweats, changes in hair or nails, polyuria, polydipsia. Musculoskeletal: Denies myalgias, back pain, joint swelling, arthralgias and gait problem.  Skin: Denies pallor, rash and wound.  Neurological: Denies dizziness, seizures, syncope, weakness, light-headedness, numbness and headaches.  Hematological: Denies adenopathy. Easy bruising, personal or family bleeding history  Psychiatric/Behavioral: Denies suicidal ideation, mood changes, confusion, nervousness, sleep disturbance and agitation   Physical Exam: Blood pressure 107/58, pulse 81, temperature 98.4 F (36.9 C), temperature source Oral, resp. rate 15, height 6' (1.829 m), weight 88.225 kg (194 lb 8 oz), SpO2 96 %. General: Alert, awake, oriented 3, very hard of hearing HEENT: Normocephalic, atraumatic, pupils equal round and reactive to light, extraocular movements intact, moist mucous membranes, bilateral hearing needs Neck: Supple,  no JVD, no lymphadenopathy, no bruits, no goiter. Cardiovascular: Regular rate and rhythm, no murmurs, rubs or gallops. Lungs: Clear to auscultation bilaterally. Abdomen: Soft, nontender, nondistended, positive bowel sounds, no masses or organomegaly  noted. Extremities: No clubbing, cyanosis or edema, positive pulses. Neurologic: Nonfocal, moves all 4 spontaneously.  Labs on Admission:  Results for orders placed or performed during the hospital encounter of 12/22/14 (from the past 48 hour(s))  CBC     Status: None   Collection Time: 12/22/14  1:12 PM  Result Value Ref Range   WBC 4.3 4.0 - 10.5 K/uL   RBC 4.45 4.22 - 5.81 MIL/uL   Hemoglobin 14.0 13.0 - 17.0 g/dL   HCT 42.2 39.0 - 52.0 %   MCV 94.8 78.0 - 100.0 fL   MCH 31.5 26.0 - 34.0 pg   MCHC 33.2 30.0 - 36.0 g/dL   RDW 14.6 11.5 - 15.5 %   Platelets 211 150 - 400 K/uL  I-stat troponin, ED     Status: None   Collection Time: 12/22/14  1:27 PM  Result Value Ref Range   Troponin i, poc 0.01 0.00 - 0.08 ng/mL   Comment 3            Comment: Due to the release kinetics of cTnI, a negative result within the first hours of the onset of symptoms does not rule out myocardial infarction with certainty. If myocardial infarction is still suspected, repeat the test at appropriate intervals.   I-stat chem 8, ed     Status: Abnormal   Collection Time: 12/22/14  1:28 PM  Result Value Ref Range   Sodium 140 135 - 145 mmol/L   Potassium 3.4 (L) 3.5 - 5.1 mmol/L   Chloride 103 101 - 111 mmol/L   BUN 16 6 - 20 mg/dL   Creatinine, Ser 1.70 (H) 0.61 - 1.24 mg/dL   Glucose, Bld 138 (H) 65 - 99 mg/dL   Calcium, Ion 1.29 1.13 - 1.30 mmol/L   TCO2 24 0 - 100 mmol/L   Hemoglobin 15.0 13.0 - 17.0 g/dL   HCT 44.0 39.0 - 52.0 %  Urinalysis, Routine w reflex microscopic (not at [facility])     Status: Abnormal   Collection Time: 12/22/14  3:28 PM  Result Value Ref Range   Color, Urine YELLOW YELLOW   APPearance HAZY (A) CLEAR   Specific Gravity, Urine 1.011 1.005 - 1.030   pH 6.0 5.0 - 8.0   Glucose, UA NEGATIVE NEGATIVE mg/dL   Hgb urine dipstick MODERATE (A) NEGATIVE   Bilirubin Urine NEGATIVE NEGATIVE   Ketones, ur NEGATIVE NEGATIVE mg/dL   Protein, ur NEGATIVE NEGATIVE mg/dL    Urobilinogen, UA 0.2 0.0 - 1.0 mg/dL   Nitrite POSITIVE (A) NEGATIVE   Leukocytes, UA MODERATE (A) NEGATIVE  Urine microscopic-add on     Status: Abnormal   Collection Time: 12/22/14  3:28 PM  Result Value Ref Range   Squamous Epithelial / LPF FEW (A) RARE   WBC, UA 21-50 <3 WBC/hpf   RBC / HPF 3-6 <3 RBC/hpf   Bacteria, UA MANY (A) RARE    Radiological Exams on Admission: Ct Head Wo Contrast  12/22/2014   CLINICAL DATA:  Several recent episodes of syncope/loss of consciousness  EXAM: CT HEAD WITHOUT CONTRAST  TECHNIQUE: Contiguous axial images were obtained from the base of the skull through the vertex without intravenous contrast.  COMPARISON:  October 05, 2011  FINDINGS: Node artifact on the left from cochlear implant. Mild generalized  atrophy is stable. There is no intracranial mass, hemorrhage, extra-axial fluid collection, or midline shift. There is patchy small vessel disease throughout the centra semiovale bilaterally. No new gray-white compartment lesions are identified. The bony calvarium appears intact. Postoperative change is noted in the left mastoid region. The mastoids on the right are clear. There is probable cerumen in the left external auditory canal.  IMPRESSION: Artifact from cochlear implant on the left. Atrophy with patchy periventricular small vessel disease, stable. No acute infarct apparent. No hemorrhage, mass, or extra-axial fluid collection. Postoperative change left mastoid region, stable. Probable cerumen in the left external auditory canal.   Electronically Signed   By: Lowella Grip III M.D.   On: 12/22/2014 14:00    Assessment/Plan Principal Problem:   Syncope Active Problems:   HTN (hypertension)   BPH (benign prostatic hyperplasia)   Dementia   Multiple syncopal events -Etiology remains unclear although possibilities include cardiac arrhythmia, severe aortic stenosis, orthostatic hypotension, seizure, less likely acute coronary syndrome and stroke given  lack of focal neurologic deficits. -Unclear if UTI playing a role in his syncope. -We'll admit as an observation to a telemetry unit to monitor cardiac rhythm, we'll cycle troponins, will obtain 2-D echo to check for valvular abnormalities specifically aortic stenosis, we'll check orthostatic vital signs. Repeat EKG in the morning. Check EEG as he did lose bladder function during one of these episodes to rule out seizure. -See no reason to obtain MRI at this point given lack of focal signs. CT scan of the head was negative for acute findings.  UTI  -Continue Rocephin pending culture data.  Hypokalemia -Repleted orally, check magnesium level.  Dementia -Continue Aricept and Namenda.  BPH -Continue Flomax  DVT prophylaxis -Subcutaneous heparin  CODE STATUS -Full code as discussed with patient, wife Altha Harm and nephew at bedside.    Time Spent on Admission: 70 minutes  HERNANDEZ ACOSTA,ESTELA [facility] Pager: [phone] 12/22/2014, 5:43 PM

## 2014-12-22 NOTE — ED Notes (Signed)
Per EMS- pt has 4 witness episodes of LOC one was witnessed by EMS. Episodes lasted approx 15 seconds. Pt denies SOB/chest pain. Pt was noted to be incontinent.pt alert and oriented.

## 2014-12-22 NOTE — ED Notes (Signed)
MD at bedside. 

## 2014-12-22 NOTE — ED Provider Notes (Signed)
CSN: [id number]     Arrival date & time 12/22/14  1232 History   First MD Initiated Contact with Patient 12/22/14 1243     Chief Complaint  Patient presents with  . Loss of Consciousness     (Consider location/radiation/quality/duration/timing/severity/associated sxs/prior Treatment) HPI Comments: The patient is a 81 year old male, he is not a good historian, there is no other family members with him. Reportedly per EMS he comes from the barber shop where he was getting a haircut, apparently the patient had had multiple syncopal episodes today each lasting approximately 15 seconds or more, the patient is unaware that they are happening, he has no prodromal symptoms, he has no memory of these events, they have each been witnessed including the last one which was witnessed by the paramedics. The patient denies chest pain shortness of breath palpitations swelling rashes numbness weakness dysuria or diarrhea. He has no headache, no blurred vision and no complaints at this time. According to paramedics the patient was noted to be incontinent  Patient is a 81 y.o. male presenting with syncope. The history is provided by the patient.  Loss of Consciousness   No past medical history on file. No past surgical history on file. No family history on file. History  Substance Use Topics  . Smoking status: Not on file  . Smokeless tobacco: Not on file  . Alcohol Use: Not on file    Review of Systems  Cardiovascular: Positive for syncope.  All other systems reviewed and are negative.     Allergies  Review of patient's allergies indicates not on file.  Home Medications   Prior to Admission medications   Not on File   BP 122/65 mmHg  Pulse 81  Temp(Src) 98.4 F (36.9 C) (Oral)  Resp 13  SpO2 95% Physical Exam  Constitutional: He appears well-developed and well-nourished. No distress.  HENT:  Head: Normocephalic and atraumatic.  Mouth/Throat: Oropharynx is clear and moist. No  oropharyngeal exudate.  Eyes: Conjunctivae and EOM are normal. Pupils are equal, round, and reactive to light. Right eye exhibits no discharge. Left eye exhibits no discharge. No scleral icterus.  Neck: Normal range of motion. Neck supple. No JVD present. No thyromegaly present.  Cardiovascular: Normal rate, regular rhythm, normal heart sounds and intact distal pulses.  Exam reveals no gallop and no friction rub.   No murmur heard. Pulmonary/Chest: Effort normal and breath sounds normal. No respiratory distress. He has no wheezes. He has no rales.  Abdominal: Soft. Bowel sounds are normal. He exhibits no distension and no mass. There is no tenderness.  Musculoskeletal: Normal range of motion. He exhibits no edema or tenderness.  Lymphadenopathy:    He has no cervical adenopathy.  Neurological: He is alert. Coordination normal.  Normal strength and sensation in all 4 extremities, cranial nerves III through XII appear to be intact. He does have some difficulty lifting his left leg but attributes this to pain in the hip and he is able to straight leg raise over 45 against resistance.  Skin: Skin is warm and dry. No rash noted. No erythema.  Psychiatric: He has a normal mood and affect. His behavior is normal.  Nursing note and vitals reviewed.   ED Course  Procedures (including critical care time) Labs Review Labs Reviewed  I-STAT CHEM 8, ED - Abnormal; Notable for the following:    Potassium 3.4 (*)    Creatinine, Ser 1.70 (*)    Glucose, Bld 138 (*)    All other components  within normal limits  CBC  URINALYSIS, ROUTINE W REFLEX MICROSCOPIC (NOT AT [facility])  I-STAT TROPOININ, ED    Imaging Review Ct Head Wo Contrast  12/22/2014   CLINICAL DATA:  Several recent episodes of syncope/loss of consciousness  EXAM: CT HEAD WITHOUT CONTRAST  TECHNIQUE: Contiguous axial images were obtained from the base of the skull through the vertex without intravenous contrast.  COMPARISON:  October 05, 2011   FINDINGS: Node artifact on the left from cochlear implant. Mild generalized atrophy is stable. There is no intracranial mass, hemorrhage, extra-axial fluid collection, or midline shift. There is patchy small vessel disease throughout the centra semiovale bilaterally. No new gray-white compartment lesions are identified. The bony calvarium appears intact. Postoperative change is noted in the left mastoid region. The mastoids on the right are clear. There is probable cerumen in the left external auditory canal.  IMPRESSION: Artifact from cochlear implant on the left. Atrophy with patchy periventricular small vessel disease, stable. No acute infarct apparent. No hemorrhage, mass, or extra-axial fluid collection. Postoperative change left mastoid region, stable. Probable cerumen in the left external auditory canal.   Electronically Signed   By: Lowella Grip III M.D.   On: 12/22/2014 14:00     EKG Interpretation   Date/Time:  Wednesday December 22 2014 12:37:28 EDT Ventricular Rate:  73 PR Interval:  190 QRS Duration: 96 QT Interval:  512 QTC Calculation: 564 R Axis:   50 Text Interpretation:  Sinus rhythm Borderline repolarization abnormality  Since last tracing T wave abnormality now present. Abnormal ekg Confirmed  by Camry Theiss  MD, Jeral Zick ([id number]) on 12/22/2014 12:49:43 PM      MDM   Final diagnoses:  Syncope  Abnormal ECG    The EKG is unremarkable other than some T-wave abnormalities which are now present. Other than that the patient has no other acute findings, we'll check lab work, CT scan of the brain, cardiac monitoring, repeat evaluation.  No further arrhythmias seen, blood pressure remains normal, EKG shows nonspecific T-wave findings CT scan without acute intracranial abnormalities, normal blood counts, normal electrolytes, renal insufficiency slightly worsened several years ago but not obvious etiology. Discussed with Dr. Jerilee Hoh will see the patient in the emergency department for  admission.    Noemi Chapel, MD 12/22/14 [phone]

## 2014-12-22 NOTE — ED Notes (Signed)
Attempted report 

## 2014-12-22 NOTE — Progress Notes (Signed)
Report received from the ED at 1650 and pt arrived to the unit via stretcher at 1710. Pt verbally responsive; oriented to the unit and room VSS; telemetry applied and verified; no pressure ulcer noted; pt in bed comfortably with call light within reach. Spouse remains at bedside. MD paged and notified of pt arrival to the unit. Will closely monitor. P. Amo Helmer Dull Rn.

## 2014-12-22 NOTE — ED Notes (Signed)
Admitting MD at bedside.

## 2014-12-23 ENCOUNTER — Observation Stay (HOSPITAL_COMMUNITY): Payer: Medicare Other

## 2014-12-23 ENCOUNTER — Encounter (HOSPITAL_COMMUNITY): Payer: Self-pay | Admitting: General Practice

## 2014-12-23 DIAGNOSIS — E039 Hypothyroidism, unspecified: Secondary | ICD-10-CM | POA: Diagnosis not present

## 2014-12-23 DIAGNOSIS — F0390 Unspecified dementia without behavioral disturbance: Secondary | ICD-10-CM

## 2014-12-23 DIAGNOSIS — E876 Hypokalemia: Secondary | ICD-10-CM | POA: Diagnosis present

## 2014-12-23 DIAGNOSIS — N400 Enlarged prostate without lower urinary tract symptoms: Secondary | ICD-10-CM | POA: Diagnosis present

## 2014-12-23 DIAGNOSIS — R55 Syncope and collapse: Secondary | ICD-10-CM

## 2014-12-23 DIAGNOSIS — Z79899 Other long term (current) drug therapy: Secondary | ICD-10-CM | POA: Diagnosis not present

## 2014-12-23 DIAGNOSIS — I10 Essential (primary) hypertension: Secondary | ICD-10-CM | POA: Diagnosis not present

## 2014-12-23 DIAGNOSIS — E860 Dehydration: Secondary | ICD-10-CM | POA: Diagnosis present

## 2014-12-23 DIAGNOSIS — N390 Urinary tract infection, site not specified: Secondary | ICD-10-CM | POA: Diagnosis not present

## 2014-12-23 LAB — BASIC METABOLIC PANEL
ANION GAP: 9 (ref 5–15)
BUN: 20 mg/dL (ref 6–20)
CO2: 21 mmol/L — AB (ref 22–32)
Calcium: 9.5 mg/dL (ref 8.9–10.3)
Chloride: 108 mmol/L (ref 101–111)
Creatinine, Ser: 1.84 mg/dL — ABNORMAL HIGH (ref 0.61–1.24)
GFR calc Af Amer: 37 mL/min — ABNORMAL LOW (ref >60)
GFR calc non Af Amer: 32 mL/min — ABNORMAL LOW (ref >60)
GLUCOSE: 84 mg/dL (ref 65–99)
POTASSIUM: 4.2 mmol/L (ref 3.5–5.1)
Sodium: 138 mmol/L (ref 135–145)

## 2014-12-23 LAB — CBC
HCT: 38.5 % — ABNORMAL LOW (ref 39.0–52.0)
Hemoglobin: 13 g/dL (ref 13.0–17.0)
MCH: 32.2 pg (ref 26.0–34.0)
MCHC: 33.8 g/dL (ref 30.0–36.0)
MCV: 95.3 fL (ref 78.0–100.0)
PLATELETS: 203 10*3/uL (ref 150–400)
RBC: 4.04 MIL/uL — AB (ref 4.22–5.81)
RDW: 15.1 % (ref 11.5–15.5)
WBC: 24.3 10*3/uL — ABNORMAL HIGH (ref 4.0–10.5)

## 2014-12-23 LAB — TSH: TSH: >90 u[IU]/mL — ABNORMAL HIGH (ref 0.350–4.500)

## 2014-12-23 LAB — T4, FREE: FREE T4: 0.23 ng/dL — AB (ref 0.61–1.12)

## 2014-12-23 MED ORDER — MAGNESIUM SULFATE IN D5W 10-5 MG/ML-% IV SOLN
1.0000 g | Freq: Once | INTRAVENOUS | Status: AC
  Administered 2014-12-23: 1 g via INTRAVENOUS
  Filled 2014-12-23: qty 100

## 2014-12-23 MED ORDER — LEVOTHYROXINE SODIUM 50 MCG PO TABS
50.0000 ug | ORAL_TABLET | Freq: Every day | ORAL | Status: DC
  Administered 2014-12-23 – 2014-12-24 (×2): 50 ug via ORAL
  Filled 2014-12-23 (×3): qty 1

## 2014-12-23 NOTE — Evaluation (Signed)
Physical Therapy Evaluation Patient Details Name: David Valenzuela MRN: [id number] DOB: 1926-06-23 Today's Date: 12/23/2014   History of Present Illness  Pt is an 81 y/o male with a PMH significant for BPH, hypertension and dementia who was at the [employer] shop today getting a haircut and had what was reported as 3 syncopal events. Wife describes that he was sitting in the chair and he sort of slumped down in the chair and was unresponsive for about 20-30 seconds, this happened 2 more times before they called EMS and it also happened one more time in the ambulance, unfortunately he was not on cardiac monitor at that time. He did lose his bladder function during one of these episodes. Patient does not recall any of these events. Per his wife, there was no rhythmic/seizure-like activity.  Clinical Impression  Pt admitted with above diagnosis. Pt currently with functional limitations due to the deficits listed below (see PT Problem List). At the time of PT eval pt was able to perform transfers and ambulation with min guard assist to supervision for safety. Pt and wife report that he is at baseline of function, however will continue to see acutely for mobility and to maximize independence prior to return home.  Pt will benefit from skilled PT to increase their independence and safety with mobility to allow discharge to the venue listed below.      Follow Up Recommendations No PT follow up    Equipment Recommendations  None recommended by PT    Recommendations for Other Services       Precautions / Restrictions Precautions Precautions: Fall Precaution Comments: Very HOH with cochlear implant Restrictions Weight Bearing Restrictions: No      Mobility  Bed Mobility Overal bed mobility: Needs Assistance Bed Mobility: Supine to Sit     Supine to sit: Supervision     General bed mobility comments: Supervision for safety. Pt was able to transition to EOB without physical assistance and HOB  slightly elevated.  Transfers Overall transfer level: Needs assistance Equipment used: Straight cane Transfers: Sit to/from Stand Sit to Stand: Min guard         General transfer comment: Guarding for safety, however no physical assist to power-up to full standing.   Ambulation/Gait Ambulation/Gait assistance: Min guard Ambulation Distance (Feet): 200 Feet Assistive device: Straight cane Gait Pattern/deviations: Step-through pattern;Decreased stride length;Trunk flexed Gait velocity: Decreased Gait velocity interpretation: Below normal speed for age/gender General Gait Details: Pt ambulated well in the hallway with [facility] and close guard for safety. Appeared unsteady occasionally but was able to recover independently. Pt and wife report that gait is at baseline.   Stairs            Wheelchair Mobility    Modified Rankin (Stroke Patients Only)       Balance Overall balance assessment: Needs assistance Sitting-balance support: Feet supported;No upper extremity supported Sitting balance-Leahy Scale: Good     Standing balance support: Single extremity supported;During functional activity Standing balance-Leahy Scale: Fair                               Pertinent Vitals/Pain Pain Assessment: No/denies pain    Home Living Family/patient expects to be discharged to:: Private residence Living Arrangements: Spouse/significant other Available Help at Discharge: Family;Available 24 hours/day Type of Home: Apartment Home Access: Level entry     Home Layout: One level Home Equipment: Cane - single point  Prior Function Level of Independence: Independent               Hand Dominance        Extremity/Trunk Assessment   Upper Extremity Assessment: LUE deficits/detail       LUE Deficits / Details: Weakness at shoulder, elbow joints as well as weak grip. Pt is unable to tell me why he is weaker on this side however it appears this is his  baseline.    Lower Extremity Assessment: LLE deficits/detail   LLE Deficits / Details: Weakness noted at the hip and knee joints. Pt reports he had hip surgery "some time ago".  Cervical / Trunk Assessment: Normal  Communication   Communication: HOH  Cognition Arousal/Alertness: Awake/alert Behavior During Therapy: WFL for tasks assessed/performed Overall Cognitive Status: Difficult to assess                      General Comments      Exercises        Assessment/Plan    PT Assessment Patient needs continued PT services  PT Diagnosis Generalized weakness   PT Problem List Decreased strength;Decreased range of motion;Decreased activity tolerance;Decreased balance;Decreased mobility;Decreased knowledge of use of DME;Decreased safety awareness;Decreased knowledge of precautions  PT Treatment Interventions DME instruction;Gait training;Stair training;Functional mobility training;Therapeutic activities;Therapeutic exercise;Neuromuscular re-education;Patient/family education   PT Goals (Current goals can be found in the Care Plan section) Acute Rehab PT Goals Patient Stated Goal: Return home to [place] as soon as possible.  PT Goal Formulation: With patient/family Time For Goal Achievement: 12/30/14 Potential to Achieve Goals: Good    Frequency Min 3X/week   Barriers to discharge        Co-evaluation               End of Session Equipment Utilized During Treatment: Gait belt Activity Tolerance: Patient tolerated treatment well Patient left: in chair;with call bell/phone within reach;with family/visitor present Nurse Communication: Mobility status    Functional Assessment Tool Used: Clinical Judgement Functional Limitation: Mobility: Walking and moving around Mobility: Walking and Moving Around Current Status [phone]): At least 1 percent but less than 20 percent impaired, limited or restricted Mobility: Walking and Moving Around Goal Status [phone]): At least 1  percent but less than 20 percent impaired, limited or restricted    Time: 5366-4403 PT Time Calculation (min) (ACUTE ONLY): 28 min   Charges:   PT Evaluation $Initial PT Evaluation Tier I: 1 Procedure PT Treatments $Gait Training: 8-22 mins   PT G Codes:   PT G-Codes **NOT FOR INPATIENT CLASS** Functional Assessment Tool Used: Clinical Judgement Functional Limitation: Mobility: Walking and moving around Mobility: Walking and Moving Around Current Status ([phone]): At least 1 percent but less than 20 percent impaired, limited or restricted Mobility: Walking and Moving Around Goal Status [phone]): At least 1 percent but less than 20 percent impaired, limited or restricted    Rolinda Roan 12/23/2014, 1:11 PM  Rolinda Roan, PT, DPT Acute Rehabilitation Services Pager: [phone]

## 2014-12-23 NOTE — Progress Notes (Signed)
Pt off unit to have his EEG done. P. Amo Bonney Berres. RN.

## 2014-12-23 NOTE — Procedures (Signed)
ELECTROENCEPHALOGRAM REPORT   Patient: David Valenzuela       Room #: 0L49 EEG No. ID: [id number] Age: 81 y.o.        Sex: male Referring Physician: Coralyn Pear Report Date:  12/23/2014        Interpreting Physician: Alexis Goodell  History: [name] is an 81 y.o. male with recurrent syncope  Medications:  Scheduled: . cefTRIAXone (ROCEPHIN)  IV  1 g Intravenous Q24H  . donepezil  20 mg Oral QHS  . heparin  5,000 Units Subcutaneous 3 times per day  . levothyroxine  50 mcg Oral QAC breakfast  . lisinopril  10 mg Oral Daily  . memantine  10 mg Oral BID  . sodium chloride  3 mL Intravenous Q12H  . tamsulosin  0.4 mg Oral BID    Conditions of Recording:  This is a 16 channel EEG carried out with the patient in the awake and drowsy states.  Description:  The waking background activity consists of a low voltage, symmetrical, fairly well organized, 8.5 Hz alpha activity, seen from the parieto-occipital and posterior temporal regions.  Low voltage fast activity, poorly organized, is seen anteriorly and is at times superimposed on more posterior regions.  A mixture of theta and alpha rhythms are seen from the central and temporal regions. The patient drowses with slowing to irregular, low voltage theta and beta activity.   Stage II sleep is not obtained. No epileptiform activity is noted.   Hyperventilation and intermittent photic stimulation were not performed.   IMPRESSION: Normal electroencephalogram, awake and drowsy. There are no focal lateralizing or epileptiform features.   Alexis Goodell, MD [facility] [phone] 12/23/2014, 11:17 AM

## 2014-12-23 NOTE — Progress Notes (Signed)
EEG completed; results pending.    

## 2014-12-23 NOTE — Progress Notes (Signed)
[facility] PROGRESS NOTE  David Valenzuela IEP:[id number] DOB: 1925-09-23 DOA: 12/22/2014 PCP: Manon Hilding, MD  Assessment/Plan: 1. Syncope -Patient reportedly having 3 episodes of syncope the day prior to hospitalization. The spells lasted for proximal we 20 seconds. -Initial CT scan of brain did not reveal evidence of acute intracranial abnormality  -He was found to have an elevated TSH of 72.197. Patient was unaware of having diagnosis of hypothyroidism. -U/A also revealed the presence of a urinary tract infection for which she was started on ceftriaxone 1 g IV every 24 hours -2D echo was ordered to assess for the possibility of cardiac manifestations of hypothyroidism including dilatation and pericardial effusion. Results are pending at the time of this dictation.  -Other possibilities to explain his syncope include hypotension, as he had a blood pressure of 80/48 during this hospitalization. Anti-hypertensive agents have been discontinued -Patient has not had further episodes of syncope since hospitalization.  2.  Hypothyroidism -Patient having an elevated TSH of 72.197. Will confirm this with a repeat TSH along with obtaining a free T4. -Meanwhile have started him on Synthroid 50 g by mouth daily. -Transthoracic echocardiogram is pending at the time of this dictation. 2D echo was ordered to assess for the possibility of cardiac manifestations of hypothyroidism including dilatation and pericardial effusion.  3.  Urinary tract infection. -Patient reporting undergoing cystoscopy about a week ago which I suspect may have led to urinary tract infection -Labs showing an elevated white count of 24,300 -He was started on ceftriaxone 1 g IV every 24 hours  4.   Hypertension. -Patient having low blood pressures during this hospitalization -Will discontinue lisinopril 10 mg by mouth daily as hypotension may have precipitated syncope as well  5.  Hypomagnesemia -Lab showing magnesium  of 1.4, he was given IV magnesium replacement, will repeat magnesium level in a.m.  Code Status: Full code Family Communication: I spoke to his wife was present at bedside Disposition Plan: Anticipate patient will be discharged home when medically stable   Antibiotics:  Ceftriaxone 1 g IV every 24 hours  HPI/Subjective: Patient is a pleasant 81 year old gentleman with a past medical history of cognitive impairment, hypertension, admitted to the medicine service on 12/22/2014 after having 3 syncopal events the day prior to hospitalization. Family members reporting finding patient slumped over and unresponsive for approximately 20 seconds. CT scan of brain did not show acute intracranial abnormality. Lab work did reveal the presence of urinary tract infection as he was started on ceftriaxone 1 g IV every 24 hours. Endocrinologic studies showed an elevated TSH of 72.197. Neither patient nor wife aware of having diagnosis of hypothyroidism. Suspect syncopal events likely combination of hypothyroidism, hypotension and possibly UTI.   Objective: Filed Vitals:   12/23/14 1112  BP: 114/60  Pulse: 76  Temp: 98.4 F (36.9 C)  Resp: 18    Intake/Output Summary (Last 24 hours) at 12/23/14 1528 Last data filed at 12/23/14 0400  Gross per 24 hour  Intake   1300 ml  Output    650 ml  Net    650 ml   Filed Weights   12/22/14 1711  Weight: 88.225 kg (194 lb 8 oz)    Exam:   General:  Patient states feeling better, he ablated down the hallway with me without issues  Cardiovascular: Regular rate and rhythm normal S1-S2  Respiratory: Normal respiratory effort, lungs are clear to auscultation bilaterally  Abdomen: Soft nontender nondistended positive bowel sounds  Musculoskeletal: No edema  Data Reviewed:  Basic Metabolic Panel:  Recent Labs Lab 12/22/14 1328 12/22/14 1918 12/23/14 0426  NA 140  --  138  K 3.4*  --  4.2  CL 103  --  108  CO2  --   --  21*  GLUCOSE 138*  --  84   BUN 16  --  20  CREATININE 1.70* 1.85* 1.84*  CALCIUM  --   --  9.5  MG  --  1.4*  --    Liver Function Tests: No results for input(s): AST, ALT, ALKPHOS, BILITOT, PROT, ALBUMIN in the last 168 hours. No results for input(s): LIPASE, AMYLASE in the last 168 hours. No results for input(s): AMMONIA in the last 168 hours. CBC:  Recent Labs Lab 12/22/14 1312 12/22/14 1328 12/22/14 1918 12/23/14 0426  WBC 4.3  --  14.1* 24.3*  HGB 14.0 15.0 13.2 13.0  HCT 42.2 44.0 39.1 38.5*  MCV 94.8  --  94.7 95.3  PLT 211  --  203 203   Cardiac Enzymes: No results for input(s): CKTOTAL, CKMB, CKMBINDEX, TROPONINI in the last 168 hours. BNP (last 3 results) No results for input(s): BNP in the last 8760 hours.  ProBNP (last 3 results) No results for input(s): PROBNP in the last 8760 hours.  CBG: No results for input(s): GLUCAP in the last 168 hours.  Recent Results (from the past 240 hour(s))  Urine culture     Status: None (Preliminary result)   Collection Time: 12/22/14  3:28 PM  Result Value Ref Range Status   Specimen Description URINE, CLEAN CATCH  Final   Special Requests NONE  Final   Culture NO GROWTH < 12 HOURS  Final   Report Status PENDING  Incomplete     Studies: Ct Head Wo Contrast  12/22/2014   CLINICAL DATA:  Several recent episodes of syncope/loss of consciousness  EXAM: CT HEAD WITHOUT CONTRAST  TECHNIQUE: Contiguous axial images were obtained from the base of the skull through the vertex without intravenous contrast.  COMPARISON:  October 05, 2011  FINDINGS: Node artifact on the left from cochlear implant. Mild generalized atrophy is stable. There is no intracranial mass, hemorrhage, extra-axial fluid collection, or midline shift. There is patchy small vessel disease throughout the centra semiovale bilaterally. No new gray-white compartment lesions are identified. The bony calvarium appears intact. Postoperative change is noted in the left mastoid region. The mastoids on  the right are clear. There is probable cerumen in the left external auditory canal.  IMPRESSION: Artifact from cochlear implant on the left. Atrophy with patchy periventricular small vessel disease, stable. No acute infarct apparent. No hemorrhage, mass, or extra-axial fluid collection. Postoperative change left mastoid region, stable. Probable cerumen in the left external auditory canal.   Electronically Signed   By: Lowella Grip III M.D.   On: 12/22/2014 14:00    Scheduled Meds: . cefTRIAXone (ROCEPHIN)  IV  1 g Intravenous Q24H  . donepezil  20 mg Oral QHS  . heparin  5,000 Units Subcutaneous 3 times per day  . levothyroxine  50 mcg Oral QAC breakfast  . lisinopril  10 mg Oral Daily  . memantine  10 mg Oral BID  . sodium chloride  3 mL Intravenous Q12H  . tamsulosin  0.4 mg Oral BID   Continuous Infusions: . sodium chloride 50 mL/hr at 12/22/14 1830    Principal Problem:   Syncope Active Problems:   HTN (hypertension)   BPH (benign prostatic hyperplasia)   Dementia    Time  spent: 35 minutes    Kelvin Cellar  [facility] Pager [phone]. If 7PM-7AM, please contact night-coverage at [email or web address], password [facility] 12/23/2014, 3:28 PM  LOS: 0 days

## 2014-12-23 NOTE — Progress Notes (Signed)
  Echocardiogram 2D Echocardiogram has been performed.  David Valenzuela 12/23/2014, 2:59 PM

## 2014-12-24 LAB — CBC
HEMATOCRIT: 36.4 % — AB (ref 39.0–52.0)
HEMOGLOBIN: 12.4 g/dL — AB (ref 13.0–17.0)
MCH: 31.9 pg (ref 26.0–34.0)
MCHC: 34.1 g/dL (ref 30.0–36.0)
MCV: 93.6 fL (ref 78.0–100.0)
Platelets: 196 10*3/uL (ref 150–400)
RBC: 3.89 MIL/uL — AB (ref 4.22–5.81)
RDW: 15.4 % (ref 11.5–15.5)
WBC: 21.3 10*3/uL — AB (ref 4.0–10.5)

## 2014-12-24 LAB — BASIC METABOLIC PANEL
Anion gap: 10 (ref 5–15)
BUN: 27 mg/dL — AB (ref 6–20)
CALCIUM: 9.5 mg/dL (ref 8.9–10.3)
CHLORIDE: 105 mmol/L (ref 101–111)
CO2: 24 mmol/L (ref 22–32)
CREATININE: 1.86 mg/dL — AB (ref 0.61–1.24)
GFR calc non Af Amer: 31 mL/min — ABNORMAL LOW (ref >60)
GFR, EST AFRICAN AMERICAN: 36 mL/min — AB (ref >60)
GLUCOSE: 50 mg/dL — AB (ref 65–99)
Potassium: 4.8 mmol/L (ref 3.5–5.1)
Sodium: 139 mmol/L (ref 135–145)

## 2014-12-24 LAB — MAGNESIUM: Magnesium: 1.8 mg/dL (ref 1.7–2.4)

## 2014-12-24 MED ORDER — CEFUROXIME AXETIL 500 MG PO TABS: 500.0000 mg | ORAL_TABLET | Freq: Two times a day (BID) | ORAL | Status: DC

## 2014-12-24 MED ORDER — LEVOTHYROXINE SODIUM 50 MCG PO TABS: 50.0000 ug | ORAL_TABLET | Freq: Every day | ORAL | Status: DC

## 2014-12-24 NOTE — Progress Notes (Signed)
S/L and monitor removed.  D/C papers reviewed with the patient and his wife.  Prescriptions given to the wife and new medications reviewed.  Patient brought to the front door via W/C.

## 2014-12-24 NOTE — Discharge Summary (Signed)
Physician Discharge Summary  David Valenzuela DDU:[id number] DOB: 1925-11-30 DOA: 12/22/2014  PCP: Manon Hilding, MD  Admit date: 12/22/2014 Discharge date: 12/24/2014  Time spent: 35 minutes  Recommendations for Outpatient Follow-up:  1. Patient found to be profoundly hypothyroid during this hospitalization with TSH of 72.19, he was started on Synthroid 50 g by mouth every daily. I discussed case with Dr.Nida of endocrinology who agreed with the Synthroid dose. Patient was set up to follow-up with endocrinology in 1 week at which time he will continue workup. 2. Follow-up on blood pressures, patient having low blood pressures for which lisinopril was discontinued, I suspect this may have contributed to syncopal event.  Discharge Diagnoses:  Principal Problem:   Syncope Active Problems:   HTN (hypertension)   BPH (benign prostatic hyperplasia)   Dementia   Hypothyroidism   Hypomagnesemia   Discharge Condition: Stable/improved  Diet recommendation: Heart healthy diet  Filed Weights   12/22/14 1711  Weight: 88.225 kg (194 lb 8 oz)    History of present illness:  Patient is a very pleasant 81 year old man with history significant for BPH, hypertension and dementia who was at the barber shop today getting a haircut and had what was reported as 3 syncopal events. Wife describes that he was sitting in the chair and he sort of slumped down in the chair and was unresponsive for about 20-30 seconds, this happened 2 more times before they called EMS and it also happened one more time in the ambulance, unfortunately he was not on cardiac monitor at that time. He did lose his bladder function during one of these episodes. Patient does not recall any of these events. Per his wife, there was no rhythmic/seizure-like activity. We have been asked to admit him for further evaluation and management. In the ED, CT scan shows no acute infarct, mild hypokalemia with a potassium of 3.4,urinalysis is positive  for UTI. I have independently reviewed his EKG which shows sinus rhythm and flattened T waves throughout.  Hospital Course:  Patient is a pleasant 81 year old gentleman with a past medical history of cognitive impairment, hypertension, admitted to the medicine service on 12/22/2014 after having 3 syncopal events the day prior to hospitalization. Family members reporting finding patient slumped over and unresponsive for approximately 20 seconds. CT scan of brain did not show acute intracranial abnormality. Lab work did reveal the presence of urinary tract infection as he was started on ceftriaxone 1 g IV every 24 hours. Endocrinologic studies showed an elevated TSH of 72.197. Neither patient nor wife aware of having diagnosis of hypothyroidism. Suspect syncopal events likely combination of hypothyroidism, hypotension and possibly UTI.    Syncope -Patient reportedly having 3 episodes of syncope the day prior to hospitalization. The spells lasted for proximal we 20 seconds. -Initial CT scan of brain did not reveal evidence of acute intracranial abnormality -He was found to have an elevated TSH of 72.197. Patient was unaware of having diagnosis of hypothyroidism. -U/A also revealed the presence of a urinary tract infection for which she was started on ceftriaxone 1 g IV every 24 hours -2D echo showed an ejection fraction of 60-65% without evidence of pericardial effusion. -I suspect his syncope was probably related to hypotension (having blood pressure of 80/48 during this hospitalization) along with hypothyroidism and dehydration that may have resulted from UTI. -Patient has not had further episodes of syncope since hospitalization. -Lisinopril was discontinued on discharge, please follow-up on blood pressures.  2. Hypothyroidism -Patient having an elevated TSH of  72.197 -Case was discussed with Dr. Dorris Fetch of endocrinology who agreed with Synthroid 50 g by mouth daily -Transthoracic echocardiogram  was within normal limits. -Patient was set up to follow-up with endocrinology in 1 week at [facility]  3. Urinary tract infection. -Patient reporting undergoing cystoscopy about a week ago which I suspect may have led to urinary tract infection -Labs showing an elevated white count of 24,300 -Patient was discharged on Ceftin 500 mg by mouth twice a day 4 days  4. Hypertension. -Discontinued lisinopril 10 mg by mouth daily secondary to hypotension  5. Hypomagnesemia -Lab showing magnesium of 1.4, he was given IV magnesium replacement -Repeat labs showing magnesium of 1.8.   Consultations:  Telephone consultation made to Dr. Dorris Fetch of endocrinology  Discharge Exam: Filed Vitals:   12/24/14 0442  BP: 91/51  Pulse: 65  Temp: 98 F (36.7 C)  Resp: 18     General: Patient states feeling well, asking to go home today. He walked down the hallway with his cane  Cardiovascular: Regular rate and rhythm normal S1-S2  Respiratory: Normal respiratory effort, lungs are clear to auscultation bilaterally  Abdomen: Soft nontender nondistended positive bowel sounds  Musculoskeletal: No edema  Discharge Instructions   Discharge Instructions    Call MD for:  difficulty breathing, headache or visual disturbances    Complete by:  As directed      Call MD for:  extreme fatigue    Complete by:  As directed      Call MD for:  hives    Complete by:  As directed      Call MD for:  persistant dizziness or light-headedness    Complete by:  As directed      Call MD for:  persistant nausea and vomiting    Complete by:  As directed      Call MD for:  redness, tenderness, or signs of infection (pain, swelling, redness, odor or green/yellow discharge around incision site)    Complete by:  As directed      Call MD for:  severe uncontrolled pain    Complete by:  As directed      Call MD for:  temperature >100.4    Complete by:  As directed      Call MD for:     Complete by:  As directed      Diet - low sodium heart healthy    Complete by:  As directed      Discharge instructions    Complete by:  As directed   Please follow up on Dr Dorris Fetch of Endocrinology in 1 week  [place], [place]     Increase activity slowly    Complete by:  As directed           Current Discharge Medication List    START taking these medications   Details  cefUROXime (CEFTIN) 500 MG tablet Take 1 tablet (500 mg total) by mouth 2 (two) times daily with a meal. Qty: 8 tablet, Refills: 0    levothyroxine (SYNTHROID, LEVOTHROID) 50 MCG tablet Take 1 tablet (50 mcg total) by mouth daily before breakfast. Qty: 30 tablet, Refills: 1      CONTINUE these medications which have NOT CHANGED   Details  donepezil (ARICEPT) 10 MG tablet Take 20 mg by mouth at bedtime.    finasteride (PROSCAR) 5 MG tablet Take 5 mg by mouth daily.    meloxicam (MOBIC) 7.5 MG tablet Take 7.5 mg  by mouth daily.    memantine (NAMENDA) 10 MG tablet Take 10 mg by mouth 2 (two) times daily.    tamsulosin (FLOMAX) 0.4 MG CAPS capsule Take 0.4 mg by mouth 2 (two) times daily.      STOP taking these medications     ciprofloxacin (CIPRO) 500 MG tablet      lisinopril (PRINIVIL,ZESTRIL) 10 MG tablet      naproxen sodium (ANAPROX) 220 MG tablet        No Known Allergies Follow-up Information    Call Manon Hilding, MD.   Specialty:  Family Medicine   Contact information:   [address] [place] [address] [phone]       Follow up with NIDA,GEBRESELASSIE, MD In 1 week.   Specialty:  Endocrinology   Contact information:   [place] [address] [phone]        The results of significant diagnostics from this hospitalization (including imaging, microbiology, ancillary and laboratory) are listed below for reference.    Significant Diagnostic Studies: Ct Head Wo Contrast  12/22/2014   CLINICAL DATA:  Several recent episodes of syncope/loss of  consciousness  EXAM: CT HEAD WITHOUT CONTRAST  TECHNIQUE: Contiguous axial images were obtained from the base of the skull through the vertex without intravenous contrast.  COMPARISON:  October 05, 2011  FINDINGS: Node artifact on the left from cochlear implant. Mild generalized atrophy is stable. There is no intracranial mass, hemorrhage, extra-axial fluid collection, or midline shift. There is patchy small vessel disease throughout the centra semiovale bilaterally. No new gray-white compartment lesions are identified. The bony calvarium appears intact. Postoperative change is noted in the left mastoid region. The mastoids on the right are clear. There is probable cerumen in the left external auditory canal.  IMPRESSION: Artifact from cochlear implant on the left. Atrophy with patchy periventricular small vessel disease, stable. No acute infarct apparent. No hemorrhage, mass, or extra-axial fluid collection. Postoperative change left mastoid region, stable. Probable cerumen in the left external auditory canal.   Electronically Signed   By: Lowella Grip III M.D.   On: 12/22/2014 14:00    Microbiology: Recent Results (from the past 240 hour(s))  Urine culture     Status: None (Preliminary result)   Collection Time: 12/22/14  3:28 PM  Result Value Ref Range Status   Specimen Description URINE, CLEAN CATCH  Final   Special Requests NONE  Final   Culture CULTURE REINCUBATED FOR BETTER GROWTH  Final   Report Status PENDING  Incomplete     Labs: Basic Metabolic Panel:  Recent Labs Lab 12/22/14 1328 12/22/14 1918 12/23/14 0426 12/24/14 0359  NA 140  --  138 139  K 3.4*  --  4.2 4.8  CL 103  --  108 105  CO2  --   --  21* 24  GLUCOSE 138*  --  84 50*  BUN 16  --  20 27*  CREATININE 1.70* 1.85* 1.84* 1.86*  CALCIUM  --   --  9.5 9.5  MG  --  1.4*  --  1.8   Liver Function Tests: No results for input(s): AST, ALT, ALKPHOS, BILITOT, PROT, ALBUMIN in the last 168 hours. No results for  input(s): LIPASE, AMYLASE in the last 168 hours. No results for input(s): AMMONIA in the last 168 hours. CBC:  Recent Labs Lab 12/22/14 1312 12/22/14 1328 12/22/14 1918 12/23/14 0426 12/24/14 0359  WBC 4.3  --  14.1* 24.3* 21.3*  HGB 14.0  15.0 13.2 13.0 12.4*  HCT 42.2 44.0 39.1 38.5* 36.4*  MCV 94.8  --  94.7 95.3 93.6  PLT 211  --  203 203 196   Cardiac Enzymes: No results for input(s): CKTOTAL, CKMB, CKMBINDEX, TROPONINI in the last 168 hours. BNP: BNP (last 3 results) No results for input(s): BNP in the last 8760 hours.  ProBNP (last 3 results) No results for input(s): PROBNP in the last 8760 hours.  CBG: No results for input(s): GLUCAP in the last 168 hours.     SignedKelvin Cellar  [facility] 12/24/2014, 12:36 PM

## 2014-12-25 LAB — URINE CULTURE

## 2014-12-27 DIAGNOSIS — N400 Enlarged prostate without lower urinary tract symptoms: Secondary | ICD-10-CM | POA: Diagnosis not present

## 2014-12-27 DIAGNOSIS — R3915 Urgency of urination: Secondary | ICD-10-CM | POA: Diagnosis not present

## 2015-01-23 DIAGNOSIS — G308 Other Alzheimer's disease: Secondary | ICD-10-CM | POA: Diagnosis not present

## 2015-01-23 DIAGNOSIS — E039 Hypothyroidism, unspecified: Secondary | ICD-10-CM | POA: Diagnosis not present

## 2015-01-23 DIAGNOSIS — R55 Syncope and collapse: Secondary | ICD-10-CM | POA: Diagnosis not present

## 2015-01-23 DIAGNOSIS — I10 Essential (primary) hypertension: Secondary | ICD-10-CM | POA: Diagnosis not present

## 2015-01-24 DIAGNOSIS — N400 Enlarged prostate without lower urinary tract symptoms: Secondary | ICD-10-CM | POA: Diagnosis not present

## 2015-01-24 DIAGNOSIS — R3915 Urgency of urination: Secondary | ICD-10-CM | POA: Diagnosis not present

## 2015-02-01 DIAGNOSIS — E039 Hypothyroidism, unspecified: Secondary | ICD-10-CM | POA: Diagnosis not present

## 2015-02-01 DIAGNOSIS — E875 Hyperkalemia: Secondary | ICD-10-CM | POA: Diagnosis not present

## 2015-02-01 DIAGNOSIS — E780 Pure hypercholesterolemia: Secondary | ICD-10-CM | POA: Diagnosis not present

## 2015-02-01 DIAGNOSIS — I10 Essential (primary) hypertension: Secondary | ICD-10-CM | POA: Diagnosis not present

## 2015-02-08 DIAGNOSIS — N183 Chronic kidney disease, stage 3 (moderate): Secondary | ICD-10-CM | POA: Diagnosis not present

## 2015-02-08 DIAGNOSIS — M1612 Unilateral primary osteoarthritis, left hip: Secondary | ICD-10-CM | POA: Diagnosis not present

## 2015-02-08 DIAGNOSIS — E039 Hypothyroidism, unspecified: Secondary | ICD-10-CM | POA: Diagnosis not present

## 2015-02-08 DIAGNOSIS — I10 Essential (primary) hypertension: Secondary | ICD-10-CM | POA: Diagnosis not present

## 2015-02-08 DIAGNOSIS — E780 Pure hypercholesterolemia: Secondary | ICD-10-CM | POA: Diagnosis not present

## 2015-02-08 DIAGNOSIS — Z23 Encounter for immunization: Secondary | ICD-10-CM | POA: Diagnosis not present

## 2015-02-08 DIAGNOSIS — H903 Sensorineural hearing loss, bilateral: Secondary | ICD-10-CM | POA: Diagnosis not present

## 2015-02-08 DIAGNOSIS — G308 Other Alzheimer's disease: Secondary | ICD-10-CM | POA: Diagnosis not present

## 2015-03-02 DIAGNOSIS — H903 Sensorineural hearing loss, bilateral: Secondary | ICD-10-CM | POA: Diagnosis not present

## 2015-03-11 DIAGNOSIS — M47816 Spondylosis without myelopathy or radiculopathy, lumbar region: Secondary | ICD-10-CM | POA: Diagnosis not present

## 2015-03-11 DIAGNOSIS — M4806 Spinal stenosis, lumbar region: Secondary | ICD-10-CM | POA: Diagnosis not present

## 2015-03-11 DIAGNOSIS — M259 Joint disorder, unspecified: Secondary | ICD-10-CM | POA: Diagnosis not present

## 2015-03-11 DIAGNOSIS — M5136 Other intervertebral disc degeneration, lumbar region: Secondary | ICD-10-CM | POA: Diagnosis not present

## 2015-03-16 DIAGNOSIS — E039 Hypothyroidism, unspecified: Secondary | ICD-10-CM | POA: Diagnosis not present

## 2015-03-18 DIAGNOSIS — M545 Low back pain: Secondary | ICD-10-CM | POA: Diagnosis not present

## 2015-03-18 DIAGNOSIS — M5136 Other intervertebral disc degeneration, lumbar region: Secondary | ICD-10-CM | POA: Diagnosis not present

## 2015-03-18 DIAGNOSIS — M4327 Fusion of spine, lumbosacral region: Secondary | ICD-10-CM | POA: Diagnosis not present

## 2015-03-21 DIAGNOSIS — Z45321 Encounter for adjustment and management of cochlear device: Secondary | ICD-10-CM | POA: Diagnosis not present

## 2015-03-21 DIAGNOSIS — H903 Sensorineural hearing loss, bilateral: Secondary | ICD-10-CM | POA: Diagnosis not present

## 2015-03-24 DIAGNOSIS — Z23 Encounter for immunization: Secondary | ICD-10-CM | POA: Diagnosis not present

## 2015-03-28 DIAGNOSIS — M4806 Spinal stenosis, lumbar region: Secondary | ICD-10-CM | POA: Diagnosis not present

## 2015-03-28 DIAGNOSIS — M5136 Other intervertebral disc degeneration, lumbar region: Secondary | ICD-10-CM | POA: Diagnosis not present

## 2015-03-28 DIAGNOSIS — N401 Enlarged prostate with lower urinary tract symptoms: Secondary | ICD-10-CM | POA: Diagnosis not present

## 2015-03-28 DIAGNOSIS — N400 Enlarged prostate without lower urinary tract symptoms: Secondary | ICD-10-CM | POA: Diagnosis not present

## 2015-03-28 DIAGNOSIS — M47816 Spondylosis without myelopathy or radiculopathy, lumbar region: Secondary | ICD-10-CM | POA: Diagnosis not present

## 2015-04-07 DIAGNOSIS — I10 Essential (primary) hypertension: Secondary | ICD-10-CM | POA: Diagnosis not present

## 2015-04-07 DIAGNOSIS — M47816 Spondylosis without myelopathy or radiculopathy, lumbar region: Secondary | ICD-10-CM | POA: Diagnosis not present

## 2015-04-07 DIAGNOSIS — F0390 Unspecified dementia without behavioral disturbance: Secondary | ICD-10-CM | POA: Diagnosis not present

## 2015-04-07 DIAGNOSIS — M5136 Other intervertebral disc degeneration, lumbar region: Secondary | ICD-10-CM | POA: Diagnosis not present

## 2015-04-07 DIAGNOSIS — E039 Hypothyroidism, unspecified: Secondary | ICD-10-CM | POA: Diagnosis not present

## 2015-04-07 DIAGNOSIS — M4806 Spinal stenosis, lumbar region: Secondary | ICD-10-CM | POA: Diagnosis not present

## 2015-04-07 DIAGNOSIS — Z79899 Other long term (current) drug therapy: Secondary | ICD-10-CM | POA: Diagnosis not present

## 2015-04-14 DIAGNOSIS — N401 Enlarged prostate with lower urinary tract symptoms: Secondary | ICD-10-CM | POA: Diagnosis not present

## 2015-04-14 DIAGNOSIS — R350 Frequency of micturition: Secondary | ICD-10-CM | POA: Diagnosis not present

## 2015-04-18 DIAGNOSIS — H903 Sensorineural hearing loss, bilateral: Secondary | ICD-10-CM | POA: Diagnosis not present

## 2015-04-29 DIAGNOSIS — E039 Hypothyroidism, unspecified: Secondary | ICD-10-CM | POA: Diagnosis not present

## 2015-05-03 DIAGNOSIS — N183 Chronic kidney disease, stage 3 (moderate): Secondary | ICD-10-CM | POA: Diagnosis not present

## 2015-05-03 DIAGNOSIS — M5136 Other intervertebral disc degeneration, lumbar region: Secondary | ICD-10-CM | POA: Diagnosis not present

## 2015-05-03 DIAGNOSIS — G308 Other Alzheimer's disease: Secondary | ICD-10-CM | POA: Diagnosis not present

## 2015-05-03 DIAGNOSIS — E039 Hypothyroidism, unspecified: Secondary | ICD-10-CM | POA: Diagnosis not present

## 2015-05-03 DIAGNOSIS — M47816 Spondylosis without myelopathy or radiculopathy, lumbar region: Secondary | ICD-10-CM | POA: Diagnosis not present

## 2015-05-03 DIAGNOSIS — I10 Essential (primary) hypertension: Secondary | ICD-10-CM | POA: Diagnosis not present

## 2015-05-16 DIAGNOSIS — R42 Dizziness and giddiness: Secondary | ICD-10-CM | POA: Diagnosis not present

## 2015-05-16 DIAGNOSIS — N401 Enlarged prostate with lower urinary tract symptoms: Secondary | ICD-10-CM | POA: Diagnosis not present

## 2015-05-16 DIAGNOSIS — R351 Nocturia: Secondary | ICD-10-CM | POA: Diagnosis not present

## 2015-06-08 DIAGNOSIS — H903 Sensorineural hearing loss, bilateral: Secondary | ICD-10-CM | POA: Diagnosis not present

## 2015-07-14 DIAGNOSIS — M47816 Spondylosis without myelopathy or radiculopathy, lumbar region: Secondary | ICD-10-CM | POA: Diagnosis not present

## 2015-07-14 DIAGNOSIS — M4806 Spinal stenosis, lumbar region: Secondary | ICD-10-CM | POA: Diagnosis not present

## 2015-07-14 DIAGNOSIS — M5136 Other intervertebral disc degeneration, lumbar region: Secondary | ICD-10-CM | POA: Diagnosis not present

## 2015-07-14 DIAGNOSIS — M1612 Unilateral primary osteoarthritis, left hip: Secondary | ICD-10-CM | POA: Diagnosis not present

## 2015-07-19 DIAGNOSIS — Z7982 Long term (current) use of aspirin: Secondary | ICD-10-CM | POA: Diagnosis not present

## 2015-07-19 DIAGNOSIS — M5136 Other intervertebral disc degeneration, lumbar region: Secondary | ICD-10-CM | POA: Diagnosis not present

## 2015-07-19 DIAGNOSIS — M4806 Spinal stenosis, lumbar region: Secondary | ICD-10-CM | POA: Diagnosis not present

## 2015-07-19 DIAGNOSIS — F0390 Unspecified dementia without behavioral disturbance: Secondary | ICD-10-CM | POA: Diagnosis not present

## 2015-07-19 DIAGNOSIS — E785 Hyperlipidemia, unspecified: Secondary | ICD-10-CM | POA: Diagnosis not present

## 2015-07-19 DIAGNOSIS — Z79899 Other long term (current) drug therapy: Secondary | ICD-10-CM | POA: Diagnosis not present

## 2015-07-19 DIAGNOSIS — E039 Hypothyroidism, unspecified: Secondary | ICD-10-CM | POA: Diagnosis not present

## 2015-07-19 DIAGNOSIS — M47816 Spondylosis without myelopathy or radiculopathy, lumbar region: Secondary | ICD-10-CM | POA: Diagnosis not present

## 2015-07-19 DIAGNOSIS — I10 Essential (primary) hypertension: Secondary | ICD-10-CM | POA: Diagnosis not present

## 2015-07-29 DIAGNOSIS — I10 Essential (primary) hypertension: Secondary | ICD-10-CM | POA: Diagnosis not present

## 2015-07-29 DIAGNOSIS — E7800 Pure hypercholesterolemia, unspecified: Secondary | ICD-10-CM | POA: Diagnosis not present

## 2015-07-29 DIAGNOSIS — N183 Chronic kidney disease, stage 3 (moderate): Secondary | ICD-10-CM | POA: Diagnosis not present

## 2015-07-29 DIAGNOSIS — G308 Other Alzheimer's disease: Secondary | ICD-10-CM | POA: Diagnosis not present

## 2015-07-29 DIAGNOSIS — E039 Hypothyroidism, unspecified: Secondary | ICD-10-CM | POA: Diagnosis not present

## 2015-07-29 DIAGNOSIS — Y648 Contaminated medical or biological substance administered by other means: Secondary | ICD-10-CM | POA: Diagnosis not present

## 2015-08-03 DIAGNOSIS — N401 Enlarged prostate with lower urinary tract symptoms: Secondary | ICD-10-CM | POA: Diagnosis not present

## 2015-08-03 DIAGNOSIS — E039 Hypothyroidism, unspecified: Secondary | ICD-10-CM | POA: Diagnosis not present

## 2015-08-03 DIAGNOSIS — G308 Other Alzheimer's disease: Secondary | ICD-10-CM | POA: Diagnosis not present

## 2015-08-03 DIAGNOSIS — M25552 Pain in left hip: Secondary | ICD-10-CM | POA: Diagnosis not present

## 2015-08-03 DIAGNOSIS — N183 Chronic kidney disease, stage 3 (moderate): Secondary | ICD-10-CM | POA: Diagnosis not present

## 2015-08-03 DIAGNOSIS — I10 Essential (primary) hypertension: Secondary | ICD-10-CM | POA: Diagnosis not present

## 2015-08-05 DIAGNOSIS — H903 Sensorineural hearing loss, bilateral: Secondary | ICD-10-CM | POA: Diagnosis not present

## 2015-08-22 DIAGNOSIS — M5136 Other intervertebral disc degeneration, lumbar region: Secondary | ICD-10-CM | POA: Diagnosis not present

## 2015-09-27 DIAGNOSIS — E785 Hyperlipidemia, unspecified: Secondary | ICD-10-CM | POA: Diagnosis not present

## 2015-09-27 DIAGNOSIS — Z79899 Other long term (current) drug therapy: Secondary | ICD-10-CM | POA: Diagnosis not present

## 2015-09-27 DIAGNOSIS — E039 Hypothyroidism, unspecified: Secondary | ICD-10-CM | POA: Diagnosis not present

## 2015-09-27 DIAGNOSIS — M47816 Spondylosis without myelopathy or radiculopathy, lumbar region: Secondary | ICD-10-CM | POA: Diagnosis not present

## 2015-09-27 DIAGNOSIS — M5136 Other intervertebral disc degeneration, lumbar region: Secondary | ICD-10-CM | POA: Diagnosis not present

## 2015-09-27 DIAGNOSIS — N400 Enlarged prostate without lower urinary tract symptoms: Secondary | ICD-10-CM | POA: Diagnosis not present

## 2015-09-27 DIAGNOSIS — F0390 Unspecified dementia without behavioral disturbance: Secondary | ICD-10-CM | POA: Diagnosis not present

## 2015-09-27 DIAGNOSIS — M4806 Spinal stenosis, lumbar region: Secondary | ICD-10-CM | POA: Diagnosis not present

## 2015-09-27 DIAGNOSIS — I10 Essential (primary) hypertension: Secondary | ICD-10-CM | POA: Diagnosis not present

## 2015-10-26 DIAGNOSIS — E7800 Pure hypercholesterolemia, unspecified: Secondary | ICD-10-CM | POA: Diagnosis not present

## 2015-10-26 DIAGNOSIS — G308 Other Alzheimer's disease: Secondary | ICD-10-CM | POA: Diagnosis not present

## 2015-10-26 DIAGNOSIS — I10 Essential (primary) hypertension: Secondary | ICD-10-CM | POA: Diagnosis not present

## 2015-10-26 DIAGNOSIS — E039 Hypothyroidism, unspecified: Secondary | ICD-10-CM | POA: Diagnosis not present

## 2015-10-26 DIAGNOSIS — E875 Hyperkalemia: Secondary | ICD-10-CM | POA: Diagnosis not present

## 2015-10-26 DIAGNOSIS — N183 Chronic kidney disease, stage 3 (moderate): Secondary | ICD-10-CM | POA: Diagnosis not present

## 2015-10-31 DIAGNOSIS — J301 Allergic rhinitis due to pollen: Secondary | ICD-10-CM | POA: Diagnosis not present

## 2015-10-31 DIAGNOSIS — N183 Chronic kidney disease, stage 3 (moderate): Secondary | ICD-10-CM | POA: Diagnosis not present

## 2015-10-31 DIAGNOSIS — E039 Hypothyroidism, unspecified: Secondary | ICD-10-CM | POA: Diagnosis not present

## 2015-10-31 DIAGNOSIS — N401 Enlarged prostate with lower urinary tract symptoms: Secondary | ICD-10-CM | POA: Diagnosis not present

## 2015-10-31 DIAGNOSIS — I10 Essential (primary) hypertension: Secondary | ICD-10-CM | POA: Diagnosis not present

## 2015-10-31 DIAGNOSIS — M25552 Pain in left hip: Secondary | ICD-10-CM | POA: Diagnosis not present

## 2015-10-31 DIAGNOSIS — G308 Other Alzheimer's disease: Secondary | ICD-10-CM | POA: Diagnosis not present

## 2015-11-14 DIAGNOSIS — Z45321 Encounter for adjustment and management of cochlear device: Secondary | ICD-10-CM | POA: Diagnosis not present

## 2015-11-14 DIAGNOSIS — H903 Sensorineural hearing loss, bilateral: Secondary | ICD-10-CM | POA: Diagnosis not present

## 2015-12-12 ENCOUNTER — Ambulatory Visit (INDEPENDENT_AMBULATORY_CARE_PROVIDER_SITE_OTHER): Payer: Medicare Other | Admitting: Otolaryngology

## 2015-12-12 DIAGNOSIS — J310 Chronic rhinitis: Secondary | ICD-10-CM | POA: Diagnosis not present

## 2015-12-20 DIAGNOSIS — R61 Generalized hyperhidrosis: Secondary | ICD-10-CM | POA: Diagnosis not present

## 2015-12-20 DIAGNOSIS — R05 Cough: Secondary | ICD-10-CM | POA: Diagnosis not present

## 2015-12-20 DIAGNOSIS — R0981 Nasal congestion: Secondary | ICD-10-CM | POA: Diagnosis not present

## 2015-12-21 ENCOUNTER — Ambulatory Visit (INDEPENDENT_AMBULATORY_CARE_PROVIDER_SITE_OTHER): Payer: Medicare Other | Admitting: Urology

## 2015-12-21 ENCOUNTER — Other Ambulatory Visit (HOSPITAL_COMMUNITY)
Admission: AD | Admit: 2015-12-21 | Discharge: 2015-12-21 | Disposition: A | Discharge status: Home / Self Care | Payer: Medicare Other | Source: Skilled Nursing Facility | Attending: Urology | Admitting: Urology

## 2015-12-21 DIAGNOSIS — N401 Enlarged prostate with lower urinary tract symptoms: Secondary | ICD-10-CM | POA: Insufficient documentation

## 2015-12-21 DIAGNOSIS — R351 Nocturia: Secondary | ICD-10-CM | POA: Diagnosis not present

## 2015-12-21 LAB — URINALYSIS, ROUTINE W REFLEX MICROSCOPIC
BILIRUBIN URINE: NEGATIVE
GLUCOSE, UA: NEGATIVE mg/dL
HGB URINE DIPSTICK: NEGATIVE
KETONES UR: NEGATIVE mg/dL
Leukocytes, UA: NEGATIVE
NITRITE: NEGATIVE
PH: 5.5 (ref 5.0–8.0)
Protein, ur: NEGATIVE mg/dL
Specific Gravity, Urine: >1.03 — ABNORMAL HIGH (ref 1.005–1.030)

## 2016-01-04 DIAGNOSIS — H903 Sensorineural hearing loss, bilateral: Secondary | ICD-10-CM | POA: Diagnosis not present

## 2016-01-09 ENCOUNTER — Other Ambulatory Visit: Payer: Self-pay | Admitting: Neurosurgery

## 2016-01-09 DIAGNOSIS — M5136 Other intervertebral disc degeneration, lumbar region: Secondary | ICD-10-CM

## 2016-01-12 DIAGNOSIS — R05 Cough: Secondary | ICD-10-CM | POA: Diagnosis not present

## 2016-01-16 ENCOUNTER — Encounter: Payer: Self-pay | Admitting: Radiology

## 2016-01-16 ENCOUNTER — Ambulatory Visit
Admission: RE | Admit: 2016-01-16 | Discharge: 2016-01-16 | Disposition: A | Discharge status: Home / Self Care | Payer: Medicare Other | Source: Ambulatory Visit | Attending: Neurosurgery | Admitting: Neurosurgery

## 2016-01-16 DIAGNOSIS — M5136 Other intervertebral disc degeneration, lumbar region: Secondary | ICD-10-CM

## 2016-01-16 DIAGNOSIS — M545 Low back pain: Secondary | ICD-10-CM | POA: Diagnosis not present

## 2016-01-16 DIAGNOSIS — N138 Other obstructive and reflux uropathy: Secondary | ICD-10-CM | POA: Insufficient documentation

## 2016-01-16 DIAGNOSIS — N401 Enlarged prostate with lower urinary tract symptoms: Secondary | ICD-10-CM

## 2016-01-16 MED ORDER — IOPAMIDOL (ISOVUE-M 200) INJECTION 41%
1.0000 mL | Freq: Once | INTRAMUSCULAR | Status: AC
  Administered 2016-01-16: 1 mL via EPIDURAL

## 2016-01-16 MED ORDER — METHYLPREDNISOLONE ACETATE 40 MG/ML INJ SUSP (RADIOLOG
120.0000 mg | Freq: Once | INTRAMUSCULAR | Status: AC
  Administered 2016-01-16: 120 mg via EPIDURAL

## 2016-01-16 NOTE — Discharge Instructions (Signed)

## 2016-03-16 DIAGNOSIS — Z45321 Encounter for adjustment and management of cochlear device: Secondary | ICD-10-CM | POA: Diagnosis not present

## 2016-03-16 DIAGNOSIS — H903 Sensorineural hearing loss, bilateral: Secondary | ICD-10-CM | POA: Diagnosis not present

## 2016-03-23 DIAGNOSIS — H903 Sensorineural hearing loss, bilateral: Secondary | ICD-10-CM | POA: Diagnosis not present

## 2016-04-12 DIAGNOSIS — E039 Hypothyroidism, unspecified: Secondary | ICD-10-CM | POA: Diagnosis not present

## 2016-04-12 DIAGNOSIS — I10 Essential (primary) hypertension: Secondary | ICD-10-CM | POA: Diagnosis not present

## 2016-04-12 DIAGNOSIS — E875 Hyperkalemia: Secondary | ICD-10-CM | POA: Diagnosis not present

## 2016-04-12 DIAGNOSIS — N183 Chronic kidney disease, stage 3 (moderate): Secondary | ICD-10-CM | POA: Diagnosis not present

## 2016-04-12 DIAGNOSIS — E7800 Pure hypercholesterolemia, unspecified: Secondary | ICD-10-CM | POA: Diagnosis not present

## 2016-04-16 DIAGNOSIS — E039 Hypothyroidism, unspecified: Secondary | ICD-10-CM | POA: Diagnosis not present

## 2016-04-16 DIAGNOSIS — G308 Other Alzheimer's disease: Secondary | ICD-10-CM | POA: Diagnosis not present

## 2016-04-16 DIAGNOSIS — N183 Chronic kidney disease, stage 3 (moderate): Secondary | ICD-10-CM | POA: Diagnosis not present

## 2016-04-16 DIAGNOSIS — I10 Essential (primary) hypertension: Secondary | ICD-10-CM | POA: Diagnosis not present

## 2016-04-16 DIAGNOSIS — M25552 Pain in left hip: Secondary | ICD-10-CM | POA: Diagnosis not present

## 2016-04-16 DIAGNOSIS — Z23 Encounter for immunization: Secondary | ICD-10-CM | POA: Diagnosis not present

## 2016-04-16 DIAGNOSIS — N401 Enlarged prostate with lower urinary tract symptoms: Secondary | ICD-10-CM | POA: Diagnosis not present

## 2016-04-16 DIAGNOSIS — Z6828 Body mass index (BMI) 28.0-28.9, adult: Secondary | ICD-10-CM | POA: Diagnosis not present

## 2016-04-20 DIAGNOSIS — H903 Sensorineural hearing loss, bilateral: Secondary | ICD-10-CM | POA: Diagnosis not present

## 2016-05-01 ENCOUNTER — Other Ambulatory Visit: Payer: Self-pay | Admitting: Neurosurgery

## 2016-05-01 DIAGNOSIS — M5136 Other intervertebral disc degeneration, lumbar region: Secondary | ICD-10-CM

## 2016-05-10 ENCOUNTER — Other Ambulatory Visit: Payer: Medicare Other

## 2016-05-11 DIAGNOSIS — Z45321 Encounter for adjustment and management of cochlear device: Secondary | ICD-10-CM | POA: Diagnosis not present

## 2016-05-11 DIAGNOSIS — H903 Sensorineural hearing loss, bilateral: Secondary | ICD-10-CM | POA: Diagnosis not present

## 2016-05-15 ENCOUNTER — Ambulatory Visit
Admission: RE | Admit: 2016-05-15 | Discharge: 2016-05-15 | Disposition: A | Discharge status: Home / Self Care | Payer: Medicare Other | Source: Ambulatory Visit | Attending: Neurosurgery | Admitting: Neurosurgery

## 2016-05-15 DIAGNOSIS — M545 Low back pain: Secondary | ICD-10-CM | POA: Diagnosis not present

## 2016-05-15 DIAGNOSIS — M5136 Other intervertebral disc degeneration, lumbar region: Secondary | ICD-10-CM

## 2016-05-15 MED ORDER — IOPAMIDOL (ISOVUE-M 200) INJECTION 41%
1.0000 mL | Freq: Once | INTRAMUSCULAR | Status: AC
  Administered 2016-05-15: 1 mL via EPIDURAL

## 2016-05-15 MED ORDER — METHYLPREDNISOLONE ACETATE 40 MG/ML INJ SUSP (RADIOLOG
120.0000 mg | Freq: Once | INTRAMUSCULAR | Status: AC
  Administered 2016-05-15: 120 mg via EPIDURAL

## 2016-05-15 NOTE — Discharge Instructions (Signed)

## 2016-06-18 DIAGNOSIS — I10 Essential (primary) hypertension: Secondary | ICD-10-CM | POA: Diagnosis not present

## 2016-06-18 DIAGNOSIS — Z6827 Body mass index (BMI) 27.0-27.9, adult: Secondary | ICD-10-CM | POA: Diagnosis not present

## 2016-06-18 DIAGNOSIS — R55 Syncope and collapse: Secondary | ICD-10-CM | POA: Diagnosis not present

## 2016-06-18 DIAGNOSIS — J069 Acute upper respiratory infection, unspecified: Secondary | ICD-10-CM | POA: Diagnosis not present

## 2016-06-18 DIAGNOSIS — G308 Other Alzheimer's disease: Secondary | ICD-10-CM | POA: Diagnosis not present

## 2016-07-23 ENCOUNTER — Encounter: Payer: Self-pay | Admitting: *Deleted

## 2016-07-24 ENCOUNTER — Ambulatory Visit (INDEPENDENT_AMBULATORY_CARE_PROVIDER_SITE_OTHER): Payer: Medicare Other | Admitting: Cardiovascular Disease

## 2016-07-24 ENCOUNTER — Encounter: Payer: Self-pay | Admitting: *Deleted

## 2016-07-24 ENCOUNTER — Encounter: Payer: Self-pay | Admitting: Cardiovascular Disease

## 2016-07-24 VITALS — BP 138/80 | HR 72 | Ht 72.0 in | Wt 200.0 lb

## 2016-07-24 DIAGNOSIS — R55 Syncope and collapse: Secondary | ICD-10-CM

## 2016-07-24 DIAGNOSIS — I340 Nonrheumatic mitral (valve) insufficiency: Secondary | ICD-10-CM | POA: Diagnosis not present

## 2016-07-24 NOTE — Patient Instructions (Addendum)
Medication Instructions:  Continue all current medications.  Labwork: none  Testing/Procedures:  Your physician has requested that you have a lexiscan myoview. For further information please visit [email or web address]. Please follow instruction sheet, as given.  Your physician has requested that you have an echocardiogram. Echocardiography is a painless test that uses sound waves to create images of your heart. It provides your doctor with information about the size and shape of your heart and how well your heart's chambers and valves are working. This procedure takes approximately one hour. There are no restrictions for this procedure.  Your physician has recommended that you wear a 30 day event monitor. Event monitors are medical devices that record the heart's electrical activity. Doctors most often [place] these monitors to diagnose arrhythmias. Arrhythmias are problems with the speed or rhythm of the heartbeat. The monitor is a small, portable device. You can wear one while you do your normal daily activities. This is usually used to diagnose what is causing palpitations/syncope (passing out).  Office will contact with results via phone or letter.    Follow-Up: 2 months   Any Other Special Instructions Will Be Listed Below (If Applicable).  If you need a refill on your cardiac medications before your next appointment, please call your pharmacy.

## 2016-07-24 NOTE — Addendum Note (Signed)
Addended by: Laurine Blazer on: 07/24/2016 10:56 AM   Modules accepted: Orders

## 2016-07-24 NOTE — Progress Notes (Signed)
CARDIOLOGY CONSULT NOTE  Patient ID: David Valenzuela MRN: [id number] DOB/AGE: [id number] 81 y.o.  Admit date: (Not on file) Primary Physician: Manon Hilding, MD Referring Physician:   Reason for Consultation: syncope  HPI: The patient is an 83 year old male with a history of hypothyroidism referred for the evaluation of syncope. He has a history of syncopal episodes and was hospitalized for this in June 2016. TSH was found to be 72 at that time. CT brain of that time did not reveal any acute intracranial abnormalities. He also had a UTI at that time.  Echocardiogram 12/23/14: Normal left ventricular systolic function and regional wall motion, LVEF [id number], grade 1 diastolic dysfunction, mild biatrial enlargement.  ECG 06/17/16: Normal sinus rhythm, no ischemic abnormalities nor arrhythmias.  He has had 2 more syncopal episodes, once in December 2017 and another on 07/14/2016. There were both accompanied by diaphoresis as per his wife. In December he was in the bedroom and then walked to the kitchen and said he did not feel well. His wife sat him down. He gradually slipped out of the chair and passed out for about 2 minutes.  The episode this month occurred at night after he finished dinner. He was sitting in his chair and passed out for about 2 minutes.  He denies antecedent chest pain, shortness of breath, and palpitations. He has had some generalized abdominal pain which has been mild.     No Known Allergies  Current Outpatient Prescriptions  Medication Sig Dispense Refill  . donepezil (ARICEPT) 10 MG tablet Take 20 mg by mouth at bedtime.    Marland Kitchen levothyroxine (SYNTHROID, LEVOTHROID) 125 MCG tablet Take 125 mcg by mouth daily before breakfast.    . meloxicam (MOBIC) 15 MG tablet Take 15 mg by mouth daily.    . memantine (NAMENDA) 10 MG tablet Take 10 mg by mouth 2 (two) times daily.    . simvastatin (ZOCOR) 20 MG tablet Take 20 mg by mouth daily.     . tamsulosin (FLOMAX)  0.4 MG CAPS capsule Take 0.4 mg by mouth 2 (two) times daily.     No current facility-administered medications for this visit.     Past Medical History:  Diagnosis Date  . Alzheimer disease   . Arthritis    "left hip" (12/23/2014)  . BPH (benign prostatic hypertrophy)   . Chronic lower back pain   . Dementia    "don't know what stage"/wife 12/23/2014  . Glaucoma of both eyes   . History of stomach ulcers 1960's  . Hypertension   . Stone, kidney    Archie Endo 04/23/2007  . Syncope and collapse 12/22/2014 X 4  . Thyroid disease    hypothyroid    Past Surgical History:  Procedure Laterality Date  . BACK SURGERY    . CATARACT EXTRACTION W/ INTRAOCULAR LENS  IMPLANT, BILATERAL Bilateral   . COCHLEAR IMPLANT Left 2012?  Marland Kitchen EXTRACORPOREAL SHOCK WAVE LITHOTRIPSY  04/23/2007   Archie Endo 04/23/2007  . HIP SURGERY Left    "arthritis"  . INGUINAL HERNIA REPAIR     "? right"  . LUMBAR DISC SURGERY  X 2  . TONSILLECTOMY  1930's    Social History   Social History  . Marital status: Married    Spouse name: N/A  . Number of children: N/A  . Years of education: N/A   Occupational History  . Not on file.   Social History Main Topics  . Smoking status: Former Smoker  Packs/day: 1.00    Years: 40.00    Types: Cigarettes  . Smokeless tobacco: Never Used     Comment: "quit smoking in the 1990's"  . Alcohol use No  . Drug use: No  . Sexual activity: No   Other Topics Concern  . Not on file   Social History Narrative  . No narrative on file     No family history of premature CAD in 1st degree relatives.  Prior to Admission medications   Medication Sig Start Date End Date Taking? Authorizing Provider  donepezil (ARICEPT) 10 MG tablet Take 20 mg by mouth at bedtime.   Yes Historical Provider, MD  levothyroxine (SYNTHROID, LEVOTHROID) 125 MCG tablet Take 125 mcg by mouth daily before breakfast.   Yes Historical Provider, MD  meloxicam (MOBIC) 15 MG tablet Take 15 mg by mouth  daily.   Yes Historical Provider, MD  memantine (NAMENDA) 10 MG tablet Take 10 mg by mouth 2 (two) times daily.   Yes Historical Provider, MD  simvastatin (ZOCOR) 20 MG tablet Take 20 mg by mouth daily.  12/07/08  Yes Historical Provider, MD  tamsulosin (FLOMAX) 0.4 MG CAPS capsule Take 0.4 mg by mouth 2 (two) times daily.   Yes Historical Provider, MD     Review of systems complete and found to be negative unless listed above in HPI     Physical exam Blood pressure 138/80, pulse 72, height 6' (1.829 m), weight 200 lb (90.7 kg), SpO2 96 %. General: NAD Neck: No JVD, no thyromegaly or thyroid nodule.  Lungs: Clear to auscultation bilaterally with normal respiratory effort. CV: Nondisplaced PMI. Regular rate and rhythm, normal S1/S2, no S3/S4, 3/6 apical holosystolic murmur.  No peripheral edema.  No carotid bruit.    Abdomen: Soft, nontender, no distention.  Skin: Intact without lesions or rashes.  Neurologic: Alert and oriented x 3.  Psych: Normal affect. Extremities: No clubbing or cyanosis.  HEENT: Normal.   ECG: Most recent ECG reviewed.  Labs:   Lab Results  Component Value Date   WBC 21.3 (H) 12/24/2014   HGB 12.4 (L) 12/24/2014   HCT 36.4 (L) 12/24/2014   MCV 93.6 12/24/2014   PLT 196 12/24/2014   No results for input(s): NA, K, CL, CO2, BUN, CREATININE, CALCIUM, PROT, BILITOT, ALKPHOS, ALT, AST, GLUCOSE in the last 168 hours.  Invalid input(s): LABALBU No results found for: CKTOTAL, CKMB, CKMBINDEX, TROPONINI No results found for: CHOL No results found for: HDL No results found for: LDLCALC No results found for: TRIG No results found for: CHOLHDL No results found for: LDLDIRECT       Studies: No results found.  ASSESSMENT AND PLAN:  1. Syncope: Unclear if this is arrhythmic in etiology. Will obtain a 30 day event monitor. Will also obtain a Lexiscan Myoview stress test will evaluate for ischemic heart disease. Will see if TSH recently checked. Will obtain  30-day event monitor. Would consider an implantable loop recorder if event monitor is unrevealing. Currently not bradycardic nor hypotensive.  2. Murmur: Apical holosystolic murmur consistent with mitral regurgitation. Will obtain an echocardiogram.   Dispo: fu 2 months   Signed: Kate Sable, M.D., F.A.C.C.  07/24/2016, 10:23 AM

## 2016-07-31 ENCOUNTER — Inpatient Hospital Stay (HOSPITAL_COMMUNITY): Admission: RE | Admit: 2016-07-31 | Payer: Medicare Other | Source: Ambulatory Visit

## 2016-07-31 ENCOUNTER — Encounter (HOSPITAL_COMMUNITY)
Admission: RE | Admit: 2016-07-31 | Discharge: 2016-07-31 | Disposition: A | Discharge status: Home / Self Care | Payer: Medicare Other | Source: Ambulatory Visit | Attending: Cardiovascular Disease | Admitting: Cardiovascular Disease

## 2016-07-31 ENCOUNTER — Ambulatory Visit (HOSPITAL_BASED_OUTPATIENT_CLINIC_OR_DEPARTMENT_OTHER)
Admission: RE | Admit: 2016-07-31 | Discharge: 2016-07-31 | Disposition: A | Discharge status: Home / Self Care | Payer: Medicare Other | Source: Ambulatory Visit | Attending: Cardiovascular Disease | Admitting: Cardiovascular Disease

## 2016-07-31 ENCOUNTER — Encounter (HOSPITAL_COMMUNITY): Payer: Self-pay

## 2016-07-31 DIAGNOSIS — I700 Atherosclerosis of aorta: Secondary | ICD-10-CM | POA: Insufficient documentation

## 2016-07-31 DIAGNOSIS — R55 Syncope and collapse: Secondary | ICD-10-CM

## 2016-07-31 DIAGNOSIS — I340 Nonrheumatic mitral (valve) insufficiency: Secondary | ICD-10-CM

## 2016-07-31 LAB — NM MYOCAR MULTI W/SPECT W/WALL MOTION / EF
CHL CUP NUCLEAR SDS: 4
CHL CUP NUCLEAR SRS: 2
CHL CUP RESTING HR STRESS: 66 {beats}/min
LV dias vol: 93 mL (ref 62–150)
LV sys vol: 30 mL
NUC STRESS TID: 1.02
Peak HR: 89 {beats}/min
RATE: 0.33
SSS: 6

## 2016-07-31 MED ORDER — SODIUM CHLORIDE 0.9% FLUSH
INTRAVENOUS | Status: AC
  Administered 2016-07-31: 10 mL via INTRAVENOUS
  Filled 2016-07-31: qty 10

## 2016-07-31 MED ORDER — REGADENOSON 0.4 MG/5ML IV SOLN
INTRAVENOUS | Status: AC
  Administered 2016-07-31: 0.4 mg via INTRAVENOUS
  Filled 2016-07-31: qty 5

## 2016-07-31 MED ORDER — TECHNETIUM TC 99M TETROFOSMIN IV KIT
10.0000 | PACK | Freq: Once | INTRAVENOUS | Status: AC | PRN
  Administered 2016-07-31: 11.1 via INTRAVENOUS

## 2016-07-31 MED ORDER — TECHNETIUM TC 99M TETROFOSMIN IV KIT
30.0000 | PACK | Freq: Once | INTRAVENOUS | Status: AC | PRN
  Administered 2016-07-31: 30.8 via INTRAVENOUS

## 2016-07-31 MED ORDER — SODIUM CHLORIDE 0.9% FLUSH
INTRAVENOUS | Status: AC
  Filled 2016-07-31: qty 200

## 2016-07-31 NOTE — Progress Notes (Signed)
*  PRELIMINARY RESULTS* Echocardiogram 2D Echocardiogram has been performed.  David Valenzuela 07/31/2016, 12:57 PM

## 2016-08-02 ENCOUNTER — Ambulatory Visit (INDEPENDENT_AMBULATORY_CARE_PROVIDER_SITE_OTHER): Payer: Medicare Other

## 2016-08-02 DIAGNOSIS — R55 Syncope and collapse: Secondary | ICD-10-CM | POA: Diagnosis not present

## 2016-08-02 DIAGNOSIS — E039 Hypothyroidism, unspecified: Secondary | ICD-10-CM | POA: Diagnosis not present

## 2016-08-03 ENCOUNTER — Encounter: Payer: Self-pay | Admitting: *Deleted

## 2016-08-06 DIAGNOSIS — G308 Other Alzheimer's disease: Secondary | ICD-10-CM | POA: Diagnosis not present

## 2016-08-06 DIAGNOSIS — R55 Syncope and collapse: Secondary | ICD-10-CM | POA: Diagnosis not present

## 2016-08-06 DIAGNOSIS — I10 Essential (primary) hypertension: Secondary | ICD-10-CM | POA: Diagnosis not present

## 2016-08-06 DIAGNOSIS — E039 Hypothyroidism, unspecified: Secondary | ICD-10-CM | POA: Diagnosis not present

## 2016-08-06 DIAGNOSIS — Z6826 Body mass index (BMI) 26.0-26.9, adult: Secondary | ICD-10-CM | POA: Diagnosis not present

## 2016-09-05 IMAGING — XA Imaging study
2 series · 2 of 2 positions shown · non-contrast
Comparison: none

CLINICAL DATA: Previous fusion L3 to sacrum. Low back, left hip and
leg pain. Spondylosis without myelopathy.

[Series 1: ortho standard · 1 of 1 slices shown (1 of 2)]
[im 1/1]
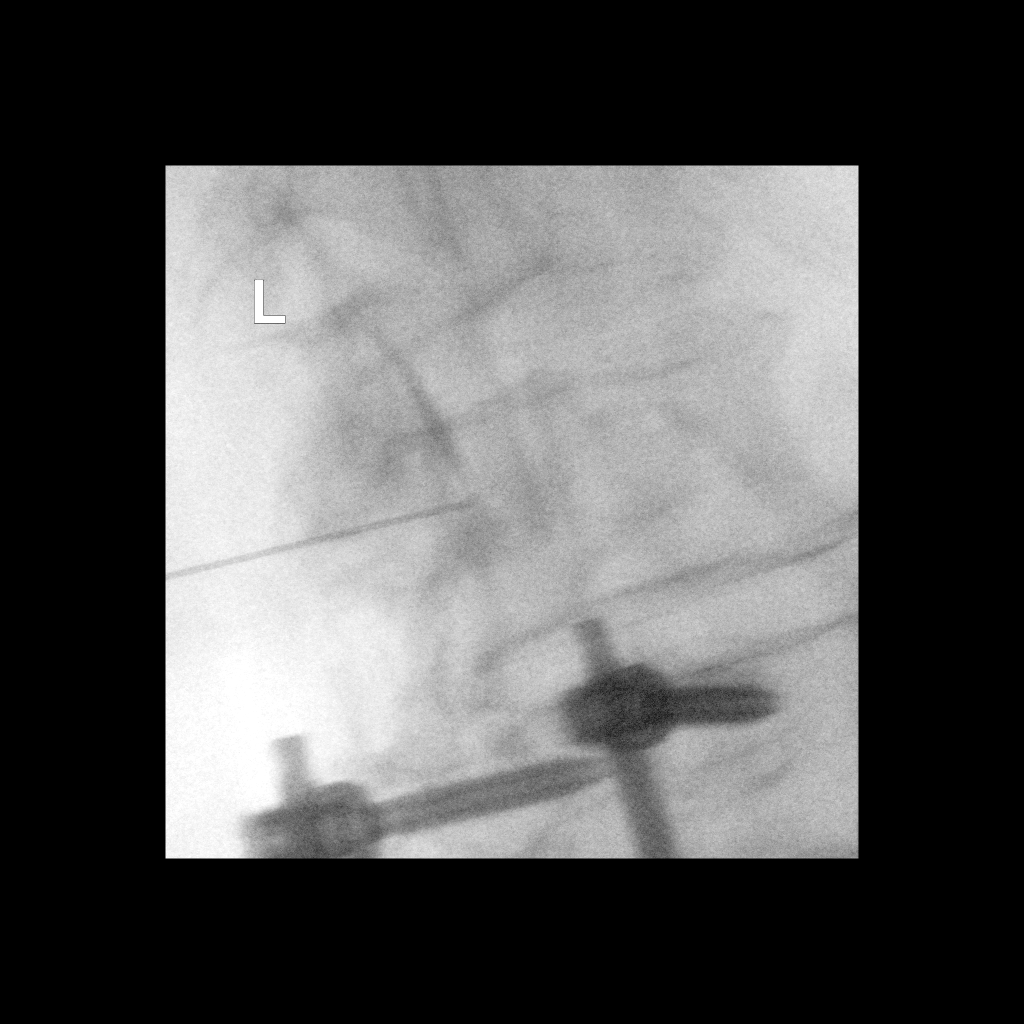

[Series 2: ortho standard · 1 of 1 slices shown (2 of 2)]
[im 1/1]
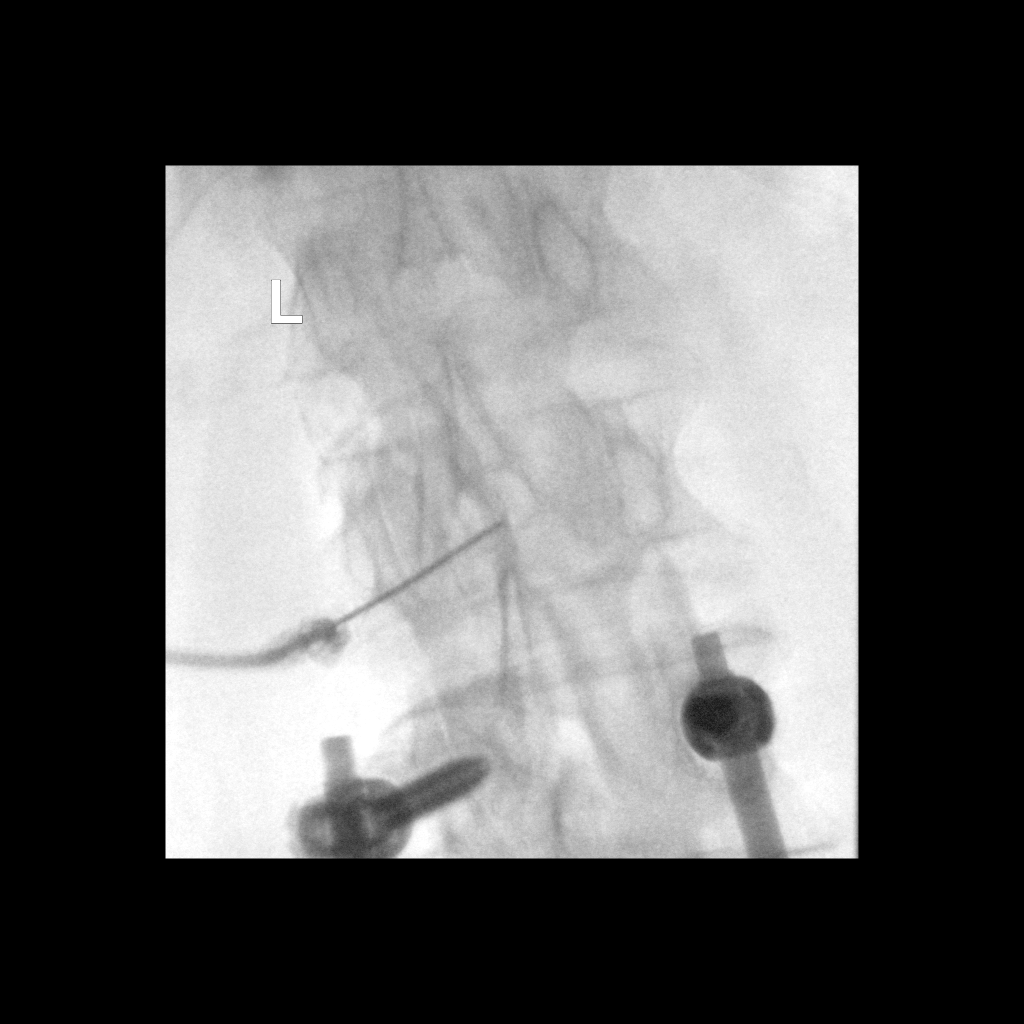

[2 of 2 positions shown; findings below may reference images not displayed]

FLUOROSCOPY TIME:  0 minutes 36 seconds. 119.19 micro gray meter
squared

PROCEDURE:
The procedure, risks, benefits, and alternatives were explained to
the patient. Questions regarding the procedure were encouraged and
answered. The patient understands and consents to the procedure.

LUMBAR EPIDURAL INJECTION:

An interlaminar approach was performed on left at L2. The overlying
skin was cleansed and anesthetized. A 20 gauge epidural needle was
advanced using loss-of-resistance technique.

DIAGNOSTIC EPIDURAL INJECTION:

Injection of Isovue-M 200 shows a good epidural pattern with spread
above and below the level of needle placement, primarily on the
left, but to both sides. No vascular opacification is seen.

THERAPEUTIC EPIDURAL INJECTION:

One hundred twenty Mg of Depo-Medrol mixed with 2 cc 1% lidocaine
were instilled. The procedure was well-tolerated, and the patient
was discharged thirty minutes following the injection in good
condition.

COMPLICATIONS:
None
IMPRESSION: Technically successful epidural injection on the left at L2 # 1

## 2016-09-14 DIAGNOSIS — E039 Hypothyroidism, unspecified: Secondary | ICD-10-CM | POA: Diagnosis not present

## 2016-09-25 ENCOUNTER — Ambulatory Visit: Payer: Medicare Other | Admitting: Cardiovascular Disease

## 2016-10-09 DIAGNOSIS — N183 Chronic kidney disease, stage 3 (moderate): Secondary | ICD-10-CM | POA: Diagnosis not present

## 2016-10-09 DIAGNOSIS — I10 Essential (primary) hypertension: Secondary | ICD-10-CM | POA: Diagnosis not present

## 2016-10-09 DIAGNOSIS — G308 Other Alzheimer's disease: Secondary | ICD-10-CM | POA: Diagnosis not present

## 2016-10-09 DIAGNOSIS — E039 Hypothyroidism, unspecified: Secondary | ICD-10-CM | POA: Diagnosis not present

## 2016-10-09 DIAGNOSIS — E875 Hyperkalemia: Secondary | ICD-10-CM | POA: Diagnosis not present

## 2016-10-09 DIAGNOSIS — E7800 Pure hypercholesterolemia, unspecified: Secondary | ICD-10-CM | POA: Diagnosis not present

## 2016-10-11 DIAGNOSIS — Z6828 Body mass index (BMI) 28.0-28.9, adult: Secondary | ICD-10-CM | POA: Diagnosis not present

## 2016-10-11 DIAGNOSIS — M25552 Pain in left hip: Secondary | ICD-10-CM | POA: Diagnosis not present

## 2016-10-11 DIAGNOSIS — G308 Other Alzheimer's disease: Secondary | ICD-10-CM | POA: Diagnosis not present

## 2016-10-11 DIAGNOSIS — N401 Enlarged prostate with lower urinary tract symptoms: Secondary | ICD-10-CM | POA: Diagnosis not present

## 2016-10-11 DIAGNOSIS — E039 Hypothyroidism, unspecified: Secondary | ICD-10-CM | POA: Diagnosis not present

## 2016-10-11 DIAGNOSIS — N183 Chronic kidney disease, stage 3 (moderate): Secondary | ICD-10-CM | POA: Diagnosis not present

## 2016-10-11 DIAGNOSIS — I10 Essential (primary) hypertension: Secondary | ICD-10-CM | POA: Diagnosis not present

## 2016-11-22 ENCOUNTER — Other Ambulatory Visit: Payer: Self-pay | Admitting: Neurosurgery

## 2016-11-22 DIAGNOSIS — M47816 Spondylosis without myelopathy or radiculopathy, lumbar region: Secondary | ICD-10-CM

## 2016-11-22 DIAGNOSIS — M5136 Other intervertebral disc degeneration, lumbar region: Secondary | ICD-10-CM

## 2016-12-05 ENCOUNTER — Other Ambulatory Visit: Payer: Medicare Other

## 2016-12-11 ENCOUNTER — Ambulatory Visit
Admission: RE | Admit: 2016-12-11 | Discharge: 2016-12-11 | Disposition: A | Discharge status: Home / Self Care | Payer: Medicare Other | Source: Ambulatory Visit | Attending: Neurosurgery | Admitting: Neurosurgery

## 2016-12-11 DIAGNOSIS — M47817 Spondylosis without myelopathy or radiculopathy, lumbosacral region: Secondary | ICD-10-CM | POA: Diagnosis not present

## 2016-12-11 DIAGNOSIS — M5136 Other intervertebral disc degeneration, lumbar region: Secondary | ICD-10-CM

## 2016-12-11 DIAGNOSIS — M47816 Spondylosis without myelopathy or radiculopathy, lumbar region: Secondary | ICD-10-CM

## 2016-12-11 MED ORDER — METHYLPREDNISOLONE ACETATE 40 MG/ML INJ SUSP (RADIOLOG
120.0000 mg | Freq: Once | INTRAMUSCULAR | Status: AC
  Administered 2016-12-11: 120 mg via EPIDURAL

## 2016-12-11 MED ORDER — IOPAMIDOL (ISOVUE-M 200) INJECTION 41%
1.0000 mL | Freq: Once | INTRAMUSCULAR | Status: AC
  Administered 2016-12-11: 1 mL via EPIDURAL

## 2016-12-11 NOTE — Discharge Instructions (Signed)

## 2017-01-30 DIAGNOSIS — G308 Other Alzheimer's disease: Secondary | ICD-10-CM | POA: Diagnosis not present

## 2017-01-30 DIAGNOSIS — I10 Essential (primary) hypertension: Secondary | ICD-10-CM | POA: Diagnosis not present

## 2017-01-30 DIAGNOSIS — E875 Hyperkalemia: Secondary | ICD-10-CM | POA: Diagnosis not present

## 2017-01-30 DIAGNOSIS — E039 Hypothyroidism, unspecified: Secondary | ICD-10-CM | POA: Diagnosis not present

## 2017-01-30 DIAGNOSIS — E7800 Pure hypercholesterolemia, unspecified: Secondary | ICD-10-CM | POA: Diagnosis not present

## 2017-02-06 DIAGNOSIS — G308 Other Alzheimer's disease: Secondary | ICD-10-CM | POA: Diagnosis not present

## 2017-02-06 DIAGNOSIS — M25552 Pain in left hip: Secondary | ICD-10-CM | POA: Diagnosis not present

## 2017-02-06 DIAGNOSIS — N401 Enlarged prostate with lower urinary tract symptoms: Secondary | ICD-10-CM | POA: Diagnosis not present

## 2017-02-06 DIAGNOSIS — N183 Chronic kidney disease, stage 3 (moderate): Secondary | ICD-10-CM | POA: Diagnosis not present

## 2017-02-06 DIAGNOSIS — Z6828 Body mass index (BMI) 28.0-28.9, adult: Secondary | ICD-10-CM | POA: Diagnosis not present

## 2017-02-06 DIAGNOSIS — E039 Hypothyroidism, unspecified: Secondary | ICD-10-CM | POA: Diagnosis not present

## 2017-02-06 DIAGNOSIS — I10 Essential (primary) hypertension: Secondary | ICD-10-CM | POA: Diagnosis not present

## 2017-05-08 DIAGNOSIS — N183 Chronic kidney disease, stage 3 (moderate): Secondary | ICD-10-CM | POA: Diagnosis not present

## 2017-05-08 DIAGNOSIS — I10 Essential (primary) hypertension: Secondary | ICD-10-CM | POA: Diagnosis not present

## 2017-05-08 DIAGNOSIS — E875 Hyperkalemia: Secondary | ICD-10-CM | POA: Diagnosis not present

## 2017-05-08 DIAGNOSIS — G308 Other Alzheimer's disease: Secondary | ICD-10-CM | POA: Diagnosis not present

## 2017-05-08 DIAGNOSIS — E039 Hypothyroidism, unspecified: Secondary | ICD-10-CM | POA: Diagnosis not present

## 2017-05-08 DIAGNOSIS — E7800 Pure hypercholesterolemia, unspecified: Secondary | ICD-10-CM | POA: Diagnosis not present

## 2017-05-13 DIAGNOSIS — Z6828 Body mass index (BMI) 28.0-28.9, adult: Secondary | ICD-10-CM | POA: Diagnosis not present

## 2017-05-13 DIAGNOSIS — N183 Chronic kidney disease, stage 3 (moderate): Secondary | ICD-10-CM | POA: Diagnosis not present

## 2017-05-13 DIAGNOSIS — E039 Hypothyroidism, unspecified: Secondary | ICD-10-CM | POA: Diagnosis not present

## 2017-05-13 DIAGNOSIS — Z23 Encounter for immunization: Secondary | ICD-10-CM | POA: Diagnosis not present

## 2017-05-13 DIAGNOSIS — M25552 Pain in left hip: Secondary | ICD-10-CM | POA: Diagnosis not present

## 2017-05-13 DIAGNOSIS — G308 Other Alzheimer's disease: Secondary | ICD-10-CM | POA: Diagnosis not present

## 2017-05-13 DIAGNOSIS — N401 Enlarged prostate with lower urinary tract symptoms: Secondary | ICD-10-CM | POA: Diagnosis not present

## 2017-05-13 DIAGNOSIS — I10 Essential (primary) hypertension: Secondary | ICD-10-CM | POA: Diagnosis not present

## 2017-05-13 DIAGNOSIS — Z0001 Encounter for general adult medical examination with abnormal findings: Secondary | ICD-10-CM | POA: Diagnosis not present

## 2017-06-23 DIAGNOSIS — E7800 Pure hypercholesterolemia, unspecified: Secondary | ICD-10-CM | POA: Diagnosis not present

## 2017-06-23 DIAGNOSIS — E039 Hypothyroidism, unspecified: Secondary | ICD-10-CM | POA: Diagnosis not present

## 2017-06-23 DIAGNOSIS — H9190 Unspecified hearing loss, unspecified ear: Secondary | ICD-10-CM | POA: Diagnosis not present

## 2017-06-23 DIAGNOSIS — I10 Essential (primary) hypertension: Secondary | ICD-10-CM | POA: Diagnosis not present

## 2017-06-23 DIAGNOSIS — Z79899 Other long term (current) drug therapy: Secondary | ICD-10-CM | POA: Diagnosis not present

## 2017-06-23 DIAGNOSIS — F0390 Unspecified dementia without behavioral disturbance: Secondary | ICD-10-CM | POA: Diagnosis not present

## 2017-06-23 DIAGNOSIS — E0590 Thyrotoxicosis, unspecified without thyrotoxic crisis or storm: Secondary | ICD-10-CM | POA: Diagnosis not present

## 2017-06-23 DIAGNOSIS — R55 Syncope and collapse: Secondary | ICD-10-CM | POA: Diagnosis not present

## 2017-06-24 DIAGNOSIS — I10 Essential (primary) hypertension: Secondary | ICD-10-CM | POA: Diagnosis not present

## 2017-06-24 DIAGNOSIS — R55 Syncope and collapse: Secondary | ICD-10-CM | POA: Diagnosis not present

## 2017-06-24 DIAGNOSIS — Z6826 Body mass index (BMI) 26.0-26.9, adult: Secondary | ICD-10-CM | POA: Diagnosis not present

## 2017-06-24 DIAGNOSIS — E039 Hypothyroidism, unspecified: Secondary | ICD-10-CM | POA: Diagnosis not present

## 2017-06-24 DIAGNOSIS — G308 Other Alzheimer's disease: Secondary | ICD-10-CM | POA: Diagnosis not present

## 2017-06-24 DIAGNOSIS — N183 Chronic kidney disease, stage 3 (moderate): Secondary | ICD-10-CM | POA: Diagnosis not present

## 2017-06-24 DIAGNOSIS — I350 Nonrheumatic aortic (valve) stenosis: Secondary | ICD-10-CM | POA: Diagnosis not present

## 2017-07-04 ENCOUNTER — Other Ambulatory Visit: Payer: Self-pay | Admitting: Neurosurgery

## 2017-07-04 DIAGNOSIS — M47816 Spondylosis without myelopathy or radiculopathy, lumbar region: Secondary | ICD-10-CM

## 2017-07-16 ENCOUNTER — Ambulatory Visit
Admission: RE | Admit: 2017-07-16 | Discharge: 2017-07-16 | Disposition: A | Discharge status: Home / Self Care | Payer: Medicare Other | Source: Ambulatory Visit | Attending: Neurosurgery | Admitting: Neurosurgery

## 2017-07-16 DIAGNOSIS — M47816 Spondylosis without myelopathy or radiculopathy, lumbar region: Secondary | ICD-10-CM | POA: Diagnosis not present

## 2017-07-16 MED ORDER — IOPAMIDOL (ISOVUE-M 200) INJECTION 41%
1.0000 mL | Freq: Once | INTRAMUSCULAR | Status: AC
  Administered 2017-07-16: 1 mL via EPIDURAL

## 2017-07-16 MED ORDER — METHYLPREDNISOLONE ACETATE 40 MG/ML INJ SUSP (RADIOLOG
120.0000 mg | Freq: Once | INTRAMUSCULAR | Status: AC
  Administered 2017-07-16: 120 mg via EPIDURAL

## 2017-07-16 NOTE — Discharge Instructions (Signed)

## 2017-07-26 DIAGNOSIS — M47816 Spondylosis without myelopathy or radiculopathy, lumbar region: Secondary | ICD-10-CM | POA: Diagnosis not present

## 2017-08-07 DIAGNOSIS — E039 Hypothyroidism, unspecified: Secondary | ICD-10-CM | POA: Diagnosis not present

## 2017-08-12 DIAGNOSIS — I340 Nonrheumatic mitral (valve) insufficiency: Secondary | ICD-10-CM | POA: Diagnosis not present

## 2017-08-12 DIAGNOSIS — R55 Syncope and collapse: Secondary | ICD-10-CM | POA: Diagnosis not present

## 2017-08-12 DIAGNOSIS — E039 Hypothyroidism, unspecified: Secondary | ICD-10-CM | POA: Diagnosis not present

## 2017-08-12 DIAGNOSIS — I10 Essential (primary) hypertension: Secondary | ICD-10-CM | POA: Diagnosis not present

## 2017-08-12 DIAGNOSIS — Z6825 Body mass index (BMI) 25.0-25.9, adult: Secondary | ICD-10-CM | POA: Diagnosis not present

## 2017-08-12 DIAGNOSIS — I350 Nonrheumatic aortic (valve) stenosis: Secondary | ICD-10-CM | POA: Diagnosis not present

## 2017-09-03 DIAGNOSIS — M47816 Spondylosis without myelopathy or radiculopathy, lumbar region: Secondary | ICD-10-CM | POA: Diagnosis not present

## 2017-09-03 DIAGNOSIS — Z6826 Body mass index (BMI) 26.0-26.9, adult: Secondary | ICD-10-CM | POA: Diagnosis not present

## 2017-09-18 DIAGNOSIS — G308 Other Alzheimer's disease: Secondary | ICD-10-CM | POA: Diagnosis not present

## 2017-09-18 DIAGNOSIS — R61 Generalized hyperhidrosis: Secondary | ICD-10-CM | POA: Diagnosis not present

## 2017-09-18 DIAGNOSIS — I340 Nonrheumatic mitral (valve) insufficiency: Secondary | ICD-10-CM | POA: Diagnosis not present

## 2017-09-18 DIAGNOSIS — E7800 Pure hypercholesterolemia, unspecified: Secondary | ICD-10-CM | POA: Diagnosis not present

## 2017-09-18 DIAGNOSIS — I10 Essential (primary) hypertension: Secondary | ICD-10-CM | POA: Diagnosis not present

## 2017-09-18 DIAGNOSIS — N183 Chronic kidney disease, stage 3 (moderate): Secondary | ICD-10-CM | POA: Diagnosis not present

## 2017-09-18 DIAGNOSIS — E875 Hyperkalemia: Secondary | ICD-10-CM | POA: Diagnosis not present

## 2017-09-18 DIAGNOSIS — E039 Hypothyroidism, unspecified: Secondary | ICD-10-CM | POA: Diagnosis not present

## 2017-09-23 DIAGNOSIS — M25552 Pain in left hip: Secondary | ICD-10-CM | POA: Diagnosis not present

## 2017-09-23 DIAGNOSIS — G308 Other Alzheimer's disease: Secondary | ICD-10-CM | POA: Diagnosis not present

## 2017-09-23 DIAGNOSIS — N183 Chronic kidney disease, stage 3 (moderate): Secondary | ICD-10-CM | POA: Diagnosis not present

## 2017-09-23 DIAGNOSIS — N401 Enlarged prostate with lower urinary tract symptoms: Secondary | ICD-10-CM | POA: Diagnosis not present

## 2017-09-23 DIAGNOSIS — Z6828 Body mass index (BMI) 28.0-28.9, adult: Secondary | ICD-10-CM | POA: Diagnosis not present

## 2017-09-23 DIAGNOSIS — E039 Hypothyroidism, unspecified: Secondary | ICD-10-CM | POA: Diagnosis not present

## 2017-09-23 DIAGNOSIS — I10 Essential (primary) hypertension: Secondary | ICD-10-CM | POA: Diagnosis not present

## 2017-10-07 DIAGNOSIS — Z6826 Body mass index (BMI) 26.0-26.9, adult: Secondary | ICD-10-CM | POA: Diagnosis not present

## 2017-10-07 DIAGNOSIS — L2089 Other atopic dermatitis: Secondary | ICD-10-CM | POA: Diagnosis not present

## 2017-11-27 ENCOUNTER — Emergency Department (HOSPITAL_COMMUNITY)
Admission: EM | Admit: 2017-11-27 | Discharge: 2017-11-27 | Disposition: A | Discharge status: Home / Self Care | Payer: Medicare Other | Attending: Emergency Medicine | Admitting: Emergency Medicine

## 2017-11-27 ENCOUNTER — Emergency Department (HOSPITAL_COMMUNITY): Payer: Medicare Other

## 2017-11-27 ENCOUNTER — Encounter (HOSPITAL_COMMUNITY): Payer: Self-pay | Admitting: Emergency Medicine

## 2017-11-27 DIAGNOSIS — I340 Nonrheumatic mitral (valve) insufficiency: Secondary | ICD-10-CM | POA: Diagnosis not present

## 2017-11-27 DIAGNOSIS — R630 Anorexia: Secondary | ICD-10-CM | POA: Diagnosis not present

## 2017-11-27 DIAGNOSIS — R072 Precordial pain: Secondary | ICD-10-CM

## 2017-11-27 DIAGNOSIS — Z79899 Other long term (current) drug therapy: Secondary | ICD-10-CM | POA: Diagnosis not present

## 2017-11-27 DIAGNOSIS — Z6825 Body mass index (BMI) 25.0-25.9, adult: Secondary | ICD-10-CM | POA: Diagnosis not present

## 2017-11-27 DIAGNOSIS — E039 Hypothyroidism, unspecified: Secondary | ICD-10-CM | POA: Diagnosis not present

## 2017-11-27 DIAGNOSIS — Z87891 Personal history of nicotine dependence: Secondary | ICD-10-CM | POA: Diagnosis not present

## 2017-11-27 DIAGNOSIS — F0390 Unspecified dementia without behavioral disturbance: Secondary | ICD-10-CM | POA: Diagnosis not present

## 2017-11-27 DIAGNOSIS — R61 Generalized hyperhidrosis: Secondary | ICD-10-CM

## 2017-11-27 DIAGNOSIS — R55 Syncope and collapse: Secondary | ICD-10-CM | POA: Diagnosis not present

## 2017-11-27 DIAGNOSIS — I10 Essential (primary) hypertension: Secondary | ICD-10-CM | POA: Diagnosis not present

## 2017-11-27 DIAGNOSIS — G308 Other Alzheimer's disease: Secondary | ICD-10-CM | POA: Diagnosis not present

## 2017-11-27 DIAGNOSIS — N183 Chronic kidney disease, stage 3 (moderate): Secondary | ICD-10-CM | POA: Diagnosis not present

## 2017-11-27 DIAGNOSIS — N3001 Acute cystitis with hematuria: Secondary | ICD-10-CM | POA: Diagnosis not present

## 2017-11-27 DIAGNOSIS — I350 Nonrheumatic aortic (valve) stenosis: Secondary | ICD-10-CM | POA: Diagnosis not present

## 2017-11-27 LAB — BASIC METABOLIC PANEL
Anion gap: 9 (ref 5–15)
BUN: 18 mg/dL (ref 6–20)
CHLORIDE: 106 mmol/L (ref 101–111)
CO2: 26 mmol/L (ref 22–32)
Calcium: 9.8 mg/dL (ref 8.9–10.3)
Creatinine, Ser: 1.18 mg/dL (ref 0.61–1.24)
GFR calc Af Amer: >60 mL/min (ref >60)
GFR calc non Af Amer: 53 mL/min — ABNORMAL LOW (ref >60)
GLUCOSE: 113 mg/dL — AB (ref 65–99)
POTASSIUM: 3.6 mmol/L (ref 3.5–5.1)
Sodium: 141 mmol/L (ref 135–145)

## 2017-11-27 LAB — HEPATIC FUNCTION PANEL
ALK PHOS: 77 U/L (ref 38–126)
ALT: 13 U/L — ABNORMAL LOW (ref 17–63)
AST: 16 U/L (ref 15–41)
Albumin: 4 g/dL (ref 3.5–5.0)
BILIRUBIN DIRECT: 0.1 mg/dL (ref 0.1–0.5)
BILIRUBIN INDIRECT: 0.6 mg/dL (ref 0.3–0.9)
TOTAL PROTEIN: 6.8 g/dL (ref 6.5–8.1)
Total Bilirubin: 0.7 mg/dL (ref 0.3–1.2)

## 2017-11-27 LAB — URINALYSIS, ROUTINE W REFLEX MICROSCOPIC
Bilirubin Urine: NEGATIVE
Glucose, UA: NEGATIVE mg/dL
Hgb urine dipstick: NEGATIVE
KETONES UR: NEGATIVE mg/dL
Nitrite: NEGATIVE
PROTEIN: NEGATIVE mg/dL
Specific Gravity, Urine: 1.021 (ref 1.005–1.030)
pH: 5 (ref 5.0–8.0)

## 2017-11-27 LAB — CBG MONITORING, ED: Glucose-Capillary: 114 mg/dL — ABNORMAL HIGH (ref 65–99)

## 2017-11-27 LAB — CBC
HEMATOCRIT: 39.9 % (ref 39.0–52.0)
Hemoglobin: 12.9 g/dL — ABNORMAL LOW (ref 13.0–17.0)
MCH: 30.6 pg (ref 26.0–34.0)
MCHC: 32.3 g/dL (ref 30.0–36.0)
MCV: 94.5 fL (ref 78.0–100.0)
Platelets: 246 10*3/uL (ref 150–400)
RBC: 4.22 MIL/uL (ref 4.22–5.81)
RDW: 13.6 % (ref 11.5–15.5)
WBC: 10.7 10*3/uL — ABNORMAL HIGH (ref 4.0–10.5)

## 2017-11-27 LAB — LIPASE, BLOOD: Lipase: 65 U/L — ABNORMAL HIGH (ref 11–51)

## 2017-11-27 LAB — TROPONIN I: Troponin I: <0.03 ng/mL (ref <0.03)

## 2017-11-27 NOTE — ED Triage Notes (Signed)
Patient sent by Dr. Edythe Lynn office for near syncopal episode. Dr. Quintin Alto called stating that patient may have had a heart attack and is concerned about patient's ekg. Patient's wife states patient has had syncopal episodes in past but was never as pale and diaphoretic. Patient has history of dementia.

## 2017-11-27 NOTE — ED Provider Notes (Signed)
[facility] EMERGENCY DEPARTMENT Provider Note   CSN: [id number] Arrival date & time: 11/27/17  1729     History   Chief Complaint Chief Complaint  Patient presents with  . Near Syncope    HPI David Valenzuela is a 84 y.o. male.  Patient has a history of dementia.  Sent in from Dr. Edythe Lynn office for near syncopal episode.  Dr. Quintin Alto called stating the patient may have had a heart attack.  Patient's wife states he is not eating well not feeling good things started yesterday.  Had some diaphoresis couple times yesterday and one time this morning.  Some vague complaint of some anterior chest pain both yesterday and today.  Patient without any fever without any vomiting without any diarrhea apparently no abdominal pain.     Past Medical History:  Diagnosis Date  . Alzheimer disease   . Arthritis    "left hip" (12/23/2014)  . BPH (benign prostatic hypertrophy)   . Chronic lower back pain   . Dementia    "don't know what stage"/wife 12/23/2014  . Glaucoma of both eyes   . History of stomach ulcers 1960's  . Hypertension   . Stone, kidney    Archie Endo 04/23/2007  . Syncope and collapse 12/22/2014 X 4  . Thyroid disease    hypothyroid    Patient Active Problem List   Diagnosis Date Noted  . Benign prostatic hyperplasia with urinary obstruction 01/16/2016  . Hypothyroidism 12/23/2014  . Hypomagnesemia 12/23/2014  . Syncope 12/22/2014  . HTN (hypertension) 12/22/2014  . BPH (benign prostatic hyperplasia) 12/22/2014  . Dementia 12/22/2014  . History of spinal surgery 09/14/2014  . Bilateral hearing loss 03/04/2014  . Cochlear implant status 03/04/2014  . CN (constipation) 03/04/2014  . Essential (primary) hypertension 03/04/2014  . Family history of breast cancer 03/04/2014  . Ex-smoker 03/04/2014  . History of biliary T-tube placement 03/04/2014  . History of excision of lamina of lumbar vertebra for decompression of spinal cord 03/04/2014  . HLD (hyperlipidemia)  03/04/2014  . Amnesia 03/04/2014  . Other specified postprocedural states 03/04/2014    Past Surgical History:  Procedure Laterality Date  . BACK SURGERY    . CATARACT EXTRACTION W/ INTRAOCULAR LENS  IMPLANT, BILATERAL Bilateral   . COCHLEAR IMPLANT Left 2012?  Marland Kitchen EXTRACORPOREAL SHOCK WAVE LITHOTRIPSY  04/23/2007   Archie Endo 04/23/2007  . HIP SURGERY Left    "arthritis"  . INGUINAL HERNIA REPAIR     "? right"  . LUMBAR DISC SURGERY  X 2  . TONSILLECTOMY  1930's        Home Medications    Prior to Admission medications   Medication Sig Start Date End Date Taking? Authorizing Provider  donepezil (ARICEPT) 10 MG tablet Take 10 mg by mouth 2 (two) times daily.    Yes [provider]  levothyroxine (SYNTHROID, LEVOTHROID) 137 MCG tablet Take 137 mcg by mouth See admin instructions. To alternate 166mcg with 12mcg daily per pharmacy records   Yes [provider]  meloxicam (MOBIC) 15 MG tablet Take 15 mg by mouth daily.   Yes [provider]  memantine (NAMENDA) 10 MG tablet Take 10 mg by mouth 2 (two) times daily.   Yes [provider]  simvastatin (ZOCOR) 20 MG tablet Take 20 mg by mouth daily.  12/07/08  Yes [provider]  tamsulosin (FLOMAX) 0.4 MG CAPS capsule Take 0.4 mg by mouth 2 (two) times daily.   Yes [provider]  traMADol Veatrice Bourbon) 50  MG tablet Take 50 mg by mouth See admin instructions. Take one tablet by mouth every day in the morning. *will take one additional in the evening if needed for pain   Yes [provider]  levothyroxine (SYNTHROID, LEVOTHROID) 125 MCG tablet Take 125 mcg by mouth See admin instructions. To alternate 113mcg with 1103mcg daily per pharmacy records 09/23/17   [provider]    Family History Family History  Problem Relation Age of Onset  . Heart attack Mother     Social History Social History   Tobacco Use  . Smoking status: Former Smoker    Packs/day: 1.00    Years:  40.00    Pack years: 40.00    Types: Cigarettes  . Smokeless tobacco: Never Used  . Tobacco comment: "quit smoking in the 1990's"  Substance Use Topics  . Alcohol use: No  . Drug use: No     Allergies   Patient has no known allergies.   Review of Systems Review of Systems  Unable to perform ROS: Dementia     Physical Exam Updated Vital Signs BP (!) 156/94 (BP Location: Right Arm)   Pulse (!) 57   Temp 99.1 F (37.3 C) (Oral)   Resp 18   Wt 90.7 kg (200 lb)   SpO2 96%   BMI 27.12 kg/m   Physical Exam  Constitutional: He appears well-developed and well-nourished. No distress.  HENT:  Head: Normocephalic and atraumatic.  Mouth/Throat: Oropharynx is clear and moist.  Eyes: Pupils are equal, round, and reactive to light. EOM are normal.  Neck: Normal range of motion. Neck supple.  Cardiovascular: Normal rate, regular rhythm and normal heart sounds.  Pulmonary/Chest: Effort normal and breath sounds normal.  Abdominal: Soft. Bowel sounds are normal. There is no tenderness.  Musculoskeletal: Normal range of motion. He exhibits no edema.  Neurological: He is alert. No cranial nerve deficit or sensory deficit. He exhibits normal muscle tone. Coordination normal.  Skin: Skin is warm.  Nursing note and vitals reviewed.    ED Treatments / Results  Labs (all labs ordered are listed, but only abnormal results are displayed) Labs Reviewed  BASIC METABOLIC PANEL - Abnormal; Notable for the following components:      Result Value   Glucose, Bld 113 (*)    GFR calc non Af Amer 53 (*)    All other components within normal limits  CBC - Abnormal; Notable for the following components:   WBC 10.7 (*)    Hemoglobin 12.9 (*)    All other components within normal limits  HEPATIC FUNCTION PANEL - Abnormal; Notable for the following components:   ALT 13 (*)    All other components within normal limits  LIPASE, BLOOD - Abnormal; Notable for the following components:   Lipase 65  (*)    All other components within normal limits  CBG MONITORING, ED - Abnormal; Notable for the following components:   Glucose-Capillary 114 (*)    All other components within normal limits  TROPONIN I  URINALYSIS, ROUTINE W REFLEX MICROSCOPIC   Results for orders placed or performed during the hospital encounter of 94/17/40  Basic metabolic panel  Result Value Ref Range   Sodium 141 135 - 145 mmol/L   Potassium 3.6 3.5 - 5.1 mmol/L   Chloride 106 101 - 111 mmol/L   CO2 26 22 - 32 mmol/L   Glucose, Bld 113 (H) 65 - 99 mg/dL   BUN 18 6 - 20 mg/dL   Creatinine,  Ser 1.18 0.61 - 1.24 mg/dL   Calcium 9.8 8.9 - 10.3 mg/dL   GFR calc non Af Amer 53 (L) >60 mL/min   GFR calc Af Amer >60 >60 mL/min   Anion gap 9 5 - 15  CBC  Result Value Ref Range   WBC 10.7 (H) 4.0 - 10.5 K/uL   RBC 4.22 4.22 - 5.81 MIL/uL   Hemoglobin 12.9 (L) 13.0 - 17.0 g/dL   HCT 39.9 39.0 - 52.0 %   MCV 94.5 78.0 - 100.0 fL   MCH 30.6 26.0 - 34.0 pg   MCHC 32.3 30.0 - 36.0 g/dL   RDW 13.6 11.5 - 15.5 %   Platelets 246 150 - 400 K/uL  Urinalysis, Routine w reflex microscopic  Result Value Ref Range   Color, Urine YELLOW YELLOW   APPearance HAZY (A) CLEAR   Specific Gravity, Urine 1.021 1.005 - 1.030   pH 5.0 5.0 - 8.0   Glucose, UA NEGATIVE NEGATIVE mg/dL   Hgb urine dipstick NEGATIVE NEGATIVE   Bilirubin Urine NEGATIVE NEGATIVE   Ketones, ur NEGATIVE NEGATIVE mg/dL   Protein, ur NEGATIVE NEGATIVE mg/dL   Nitrite NEGATIVE NEGATIVE   Leukocytes, UA SMALL (A) NEGATIVE   RBC / HPF 21-50 0 - 5 RBC/hpf   WBC, UA 21-50 0 - 5 WBC/hpf   Bacteria, UA RARE (A) NONE SEEN   Squamous Epithelial / LPF 0-5 0 - 5   Mucus PRESENT    Hyaline Casts, UA PRESENT    Ca Oxalate Crys, UA PRESENT   Troponin I  Result Value Ref Range   Troponin I <0.03 <0.03 ng/mL  Hepatic function panel  Result Value Ref Range   Total Protein 6.8 6.5 - 8.1 g/dL   Albumin 4.0 3.5 - 5.0 g/dL   AST 16 15 - 41 U/L   ALT 13 (L) 17 - 63  U/L   Alkaline Phosphatase 77 38 - 126 U/L   Total Bilirubin 0.7 0.3 - 1.2 mg/dL   Bilirubin, Direct 0.1 0.1 - 0.5 mg/dL   Indirect Bilirubin 0.6 0.3 - 0.9 mg/dL  Lipase, blood  Result Value Ref Range   Lipase 65 (H) 11 - 51 U/L  CBG monitoring, ED  Result Value Ref Range   Glucose-Capillary 114 (H) 65 - 99 mg/dL     EKG EKG Interpretation  Date/Time:  Wednesday Nov 27 2017 17:41:44 EDT Ventricular Rate:  64 PR Interval:    QRS Duration: 94 QT Interval:  406 QTC Calculation: 419 R Axis:   57 Text Interpretation:  Sinus rhythm Atrial premature complex Confirmed by Fredia Sorrow [phone]) on 11/27/2017 5:56:43 PM   Radiology Dg Chest 2 View  Result Date: 11/27/2017 CLINICAL DATA:  Near syncope, abnormal EKG in drs office. History of HTN, Alzheimer's/dementia. Pt was unable to elaborate on pain or symptoms. EXAM: CHEST - 2 VIEW COMPARISON:  01/12/2016 and previous FINDINGS: Coarse linear subsegmental atelectasis or scarring at the left lung base, stable since prior studies. No new infiltrate or overt edema. Heart size upper limits normal. Mitral annulus calcifications. Aortic Atherosclerosis (ICD10-170.0) No effusion. Visualized bones unremarkable. IMPRESSION: Chronic interstitial changes at the left lung base. No acute disease. Electronically Signed   By: Lucrezia Europe M.D.   On: 11/27/2017 19:54   Ct Head Wo Contrast  Result Date: 11/27/2017 CLINICAL DATA:  near syncopal episode. Dr. Quintin Alto called stating that patient may have had a heart attack and is concerned about patient's ekg. Patient's wife states patient has had  syncopal episodes in past but was never as pale and diaphoretic. Patient has history of dementia. EXAM: CT HEAD WITHOUT CONTRAST TECHNIQUE: Contiguous axial images were obtained from the base of the skull through the vertex without intravenous contrast. COMPARISON:  12/22/2014 FINDINGS: Brain: Streak artifact from left cochlear implant. Diffuse parenchymal atrophy.  Patchy areas of hypoattenuation in deep and periventricular white matter bilaterally. Negative for acute intracranial hemorrhage, mass lesion, acute infarction, midline shift, or mass-effect. Acute infarct may be inapparent on noncontrast CT. Ventricles and sulci symmetric. Vascular: No hyperdense vessel or unexpected calcification. Skull: Postop change in the left mastoid with cochlear implant. No acute fracture. Sinuses/Orbits: No acute finding. Other: None. IMPRESSION: 1. Negative for bleed or other acute intracranial process. 2. Atrophy and nonspecific white matter changes. Electronically Signed   By: Lucrezia Europe M.D.   On: 11/27/2017 20:08    Procedures Procedures (including critical care time)  Medications Ordered in ED Medications - No data to display   Initial Impression / Assessment and Plan / ED Course  I have reviewed the triage vital signs and the nursing notes.  Pertinent labs & imaging results that were available during my care of the patient were reviewed by me and considered in my medical decision making (see chart for details).    Patient and wife insisted on leaving prior to urinalysis results.  We told him that they would probably be back in 10 minutes.  But they insisted on leaving.  As it turns out urinalysis seems to be consistent with a urinary tract infection.  Is been too long to send for urine culture at this time.  We will leave a prescription for Keflex and have the nurses call them in the morning.  He will need to pick this up and take the medication as directed.   Final Clinical Impressions(s) / ED Diagnoses   Final diagnoses:  Diaphoresis  Dementia without behavioral disturbance, unspecified dementia type  Precordial pain    ED Discharge Orders    None       Fredia Sorrow, MD 11/28/17 [phone]

## 2017-11-27 NOTE — Discharge Instructions (Addendum)
Work-up here without any acute findings.  Patient seems to be nontoxic no acute distress.  Troponin was negative.  Do not feel that there was an acute cardiac event over the past 2 days. Urinalysis is not back will follow up and call if abnormal.   Urinalysis was concerning for urinary tract infection take the antibiotic Keflex as directed.  Prescription left with charge nurse for you to come in and pick it up.

## 2017-11-28 NOTE — ED Notes (Signed)
Spoke with pt's son and called in prescription of keflex to [facility] as requested. Discussed d/c instructions with son and he verbalized understanding.  Keflex 500mg  po qid #28.

## 2018-01-09 DIAGNOSIS — R399 Unspecified symptoms and signs involving the genitourinary system: Secondary | ICD-10-CM | POA: Diagnosis not present

## 2018-01-09 DIAGNOSIS — R4182 Altered mental status, unspecified: Secondary | ICD-10-CM | POA: Diagnosis not present

## 2018-01-23 DIAGNOSIS — Z79899 Other long term (current) drug therapy: Secondary | ICD-10-CM | POA: Diagnosis not present

## 2018-01-23 DIAGNOSIS — N401 Enlarged prostate with lower urinary tract symptoms: Secondary | ICD-10-CM | POA: Diagnosis not present

## 2018-01-23 DIAGNOSIS — E782 Mixed hyperlipidemia: Secondary | ICD-10-CM | POA: Diagnosis not present

## 2018-01-23 DIAGNOSIS — G8929 Other chronic pain: Secondary | ICD-10-CM | POA: Diagnosis not present

## 2018-01-23 DIAGNOSIS — E039 Hypothyroidism, unspecified: Secondary | ICD-10-CM | POA: Diagnosis not present

## 2018-01-23 DIAGNOSIS — M25552 Pain in left hip: Secondary | ICD-10-CM | POA: Diagnosis not present

## 2018-01-23 DIAGNOSIS — R4182 Altered mental status, unspecified: Secondary | ICD-10-CM | POA: Diagnosis not present

## 2018-02-21 DIAGNOSIS — M5116 Intervertebral disc disorders with radiculopathy, lumbar region: Secondary | ICD-10-CM | POA: Diagnosis not present

## 2018-02-28 DIAGNOSIS — M961 Postlaminectomy syndrome, not elsewhere classified: Secondary | ICD-10-CM | POA: Diagnosis not present

## 2018-02-28 DIAGNOSIS — M545 Low back pain: Secondary | ICD-10-CM | POA: Diagnosis not present

## 2018-02-28 DIAGNOSIS — M5416 Radiculopathy, lumbar region: Secondary | ICD-10-CM | POA: Diagnosis not present

## 2018-04-21 DIAGNOSIS — M6281 Muscle weakness (generalized): Secondary | ICD-10-CM | POA: Diagnosis not present

## 2018-04-21 DIAGNOSIS — R2681 Unsteadiness on feet: Secondary | ICD-10-CM | POA: Diagnosis not present

## 2018-04-21 DIAGNOSIS — M25552 Pain in left hip: Secondary | ICD-10-CM | POA: Diagnosis not present

## 2018-04-22 DIAGNOSIS — M25552 Pain in left hip: Secondary | ICD-10-CM | POA: Diagnosis not present

## 2018-04-22 DIAGNOSIS — M6281 Muscle weakness (generalized): Secondary | ICD-10-CM | POA: Diagnosis not present

## 2018-04-22 DIAGNOSIS — R2681 Unsteadiness on feet: Secondary | ICD-10-CM | POA: Diagnosis not present

## 2018-04-23 DIAGNOSIS — R2681 Unsteadiness on feet: Secondary | ICD-10-CM | POA: Diagnosis not present

## 2018-04-23 DIAGNOSIS — M6281 Muscle weakness (generalized): Secondary | ICD-10-CM | POA: Diagnosis not present

## 2018-04-23 DIAGNOSIS — M25552 Pain in left hip: Secondary | ICD-10-CM | POA: Diagnosis not present

## 2018-04-24 DIAGNOSIS — M25552 Pain in left hip: Secondary | ICD-10-CM | POA: Diagnosis not present

## 2018-04-24 DIAGNOSIS — M6281 Muscle weakness (generalized): Secondary | ICD-10-CM | POA: Diagnosis not present

## 2018-04-24 DIAGNOSIS — R2681 Unsteadiness on feet: Secondary | ICD-10-CM | POA: Diagnosis not present

## 2018-04-25 DIAGNOSIS — M25552 Pain in left hip: Secondary | ICD-10-CM | POA: Diagnosis not present

## 2018-04-25 DIAGNOSIS — M6281 Muscle weakness (generalized): Secondary | ICD-10-CM | POA: Diagnosis not present

## 2018-04-25 DIAGNOSIS — R2681 Unsteadiness on feet: Secondary | ICD-10-CM | POA: Diagnosis not present

## 2018-04-28 DIAGNOSIS — M6281 Muscle weakness (generalized): Secondary | ICD-10-CM | POA: Diagnosis not present

## 2018-04-28 DIAGNOSIS — M25552 Pain in left hip: Secondary | ICD-10-CM | POA: Diagnosis not present

## 2018-04-28 DIAGNOSIS — R2681 Unsteadiness on feet: Secondary | ICD-10-CM | POA: Diagnosis not present

## 2018-04-29 DIAGNOSIS — R2681 Unsteadiness on feet: Secondary | ICD-10-CM | POA: Diagnosis not present

## 2018-04-29 DIAGNOSIS — M25552 Pain in left hip: Secondary | ICD-10-CM | POA: Diagnosis not present

## 2018-04-29 DIAGNOSIS — M6281 Muscle weakness (generalized): Secondary | ICD-10-CM | POA: Diagnosis not present

## 2018-04-30 DIAGNOSIS — R2681 Unsteadiness on feet: Secondary | ICD-10-CM | POA: Diagnosis not present

## 2018-04-30 DIAGNOSIS — M25552 Pain in left hip: Secondary | ICD-10-CM | POA: Diagnosis not present

## 2018-04-30 DIAGNOSIS — M6281 Muscle weakness (generalized): Secondary | ICD-10-CM | POA: Diagnosis not present

## 2018-05-01 DIAGNOSIS — M6281 Muscle weakness (generalized): Secondary | ICD-10-CM | POA: Diagnosis not present

## 2018-05-01 DIAGNOSIS — M25552 Pain in left hip: Secondary | ICD-10-CM | POA: Diagnosis not present

## 2018-05-01 DIAGNOSIS — R2681 Unsteadiness on feet: Secondary | ICD-10-CM | POA: Diagnosis not present

## 2018-05-02 DIAGNOSIS — R41841 Cognitive communication deficit: Secondary | ICD-10-CM | POA: Diagnosis not present

## 2018-05-02 DIAGNOSIS — M6281 Muscle weakness (generalized): Secondary | ICD-10-CM | POA: Diagnosis not present

## 2018-05-02 DIAGNOSIS — R4701 Aphasia: Secondary | ICD-10-CM | POA: Diagnosis not present

## 2018-05-02 DIAGNOSIS — M25552 Pain in left hip: Secondary | ICD-10-CM | POA: Diagnosis not present

## 2018-05-02 DIAGNOSIS — R2681 Unsteadiness on feet: Secondary | ICD-10-CM | POA: Diagnosis not present

## 2018-05-05 DIAGNOSIS — M6281 Muscle weakness (generalized): Secondary | ICD-10-CM | POA: Diagnosis not present

## 2018-05-05 DIAGNOSIS — M25552 Pain in left hip: Secondary | ICD-10-CM | POA: Diagnosis not present

## 2018-05-05 DIAGNOSIS — R4701 Aphasia: Secondary | ICD-10-CM | POA: Diagnosis not present

## 2018-05-05 DIAGNOSIS — R2681 Unsteadiness on feet: Secondary | ICD-10-CM | POA: Diagnosis not present

## 2018-05-05 DIAGNOSIS — R41841 Cognitive communication deficit: Secondary | ICD-10-CM | POA: Diagnosis not present

## 2018-05-06 DIAGNOSIS — R4701 Aphasia: Secondary | ICD-10-CM | POA: Diagnosis not present

## 2018-05-06 DIAGNOSIS — R41841 Cognitive communication deficit: Secondary | ICD-10-CM | POA: Diagnosis not present

## 2018-05-06 DIAGNOSIS — R2681 Unsteadiness on feet: Secondary | ICD-10-CM | POA: Diagnosis not present

## 2018-05-06 DIAGNOSIS — M6281 Muscle weakness (generalized): Secondary | ICD-10-CM | POA: Diagnosis not present

## 2018-05-06 DIAGNOSIS — M25552 Pain in left hip: Secondary | ICD-10-CM | POA: Diagnosis not present

## 2018-05-08 DIAGNOSIS — M25552 Pain in left hip: Secondary | ICD-10-CM | POA: Diagnosis not present

## 2018-05-08 DIAGNOSIS — R4701 Aphasia: Secondary | ICD-10-CM | POA: Diagnosis not present

## 2018-05-08 DIAGNOSIS — R2681 Unsteadiness on feet: Secondary | ICD-10-CM | POA: Diagnosis not present

## 2018-05-08 DIAGNOSIS — R41841 Cognitive communication deficit: Secondary | ICD-10-CM | POA: Diagnosis not present

## 2018-05-08 DIAGNOSIS — M6281 Muscle weakness (generalized): Secondary | ICD-10-CM | POA: Diagnosis not present

## 2018-05-09 DIAGNOSIS — R4701 Aphasia: Secondary | ICD-10-CM | POA: Diagnosis not present

## 2018-05-09 DIAGNOSIS — R41841 Cognitive communication deficit: Secondary | ICD-10-CM | POA: Diagnosis not present

## 2018-05-09 DIAGNOSIS — M25552 Pain in left hip: Secondary | ICD-10-CM | POA: Diagnosis not present

## 2018-05-09 DIAGNOSIS — R2681 Unsteadiness on feet: Secondary | ICD-10-CM | POA: Diagnosis not present

## 2018-05-09 DIAGNOSIS — M6281 Muscle weakness (generalized): Secondary | ICD-10-CM | POA: Diagnosis not present

## 2018-05-12 DIAGNOSIS — R2681 Unsteadiness on feet: Secondary | ICD-10-CM | POA: Diagnosis not present

## 2018-05-12 DIAGNOSIS — R41841 Cognitive communication deficit: Secondary | ICD-10-CM | POA: Diagnosis not present

## 2018-05-12 DIAGNOSIS — M6281 Muscle weakness (generalized): Secondary | ICD-10-CM | POA: Diagnosis not present

## 2018-05-12 DIAGNOSIS — R4701 Aphasia: Secondary | ICD-10-CM | POA: Diagnosis not present

## 2018-05-12 DIAGNOSIS — M25552 Pain in left hip: Secondary | ICD-10-CM | POA: Diagnosis not present

## 2018-05-13 DIAGNOSIS — R2681 Unsteadiness on feet: Secondary | ICD-10-CM | POA: Diagnosis not present

## 2018-05-13 DIAGNOSIS — R41841 Cognitive communication deficit: Secondary | ICD-10-CM | POA: Diagnosis not present

## 2018-05-13 DIAGNOSIS — R4701 Aphasia: Secondary | ICD-10-CM | POA: Diagnosis not present

## 2018-05-13 DIAGNOSIS — M6281 Muscle weakness (generalized): Secondary | ICD-10-CM | POA: Diagnosis not present

## 2018-05-13 DIAGNOSIS — M25552 Pain in left hip: Secondary | ICD-10-CM | POA: Diagnosis not present

## 2018-05-14 DIAGNOSIS — M25552 Pain in left hip: Secondary | ICD-10-CM | POA: Diagnosis not present

## 2018-05-14 DIAGNOSIS — R41841 Cognitive communication deficit: Secondary | ICD-10-CM | POA: Diagnosis not present

## 2018-05-14 DIAGNOSIS — R2681 Unsteadiness on feet: Secondary | ICD-10-CM | POA: Diagnosis not present

## 2018-05-14 DIAGNOSIS — R4701 Aphasia: Secondary | ICD-10-CM | POA: Diagnosis not present

## 2018-05-14 DIAGNOSIS — M6281 Muscle weakness (generalized): Secondary | ICD-10-CM | POA: Diagnosis not present

## 2018-05-15 DIAGNOSIS — M25552 Pain in left hip: Secondary | ICD-10-CM | POA: Diagnosis not present

## 2018-05-15 DIAGNOSIS — R4701 Aphasia: Secondary | ICD-10-CM | POA: Diagnosis not present

## 2018-05-15 DIAGNOSIS — M6281 Muscle weakness (generalized): Secondary | ICD-10-CM | POA: Diagnosis not present

## 2018-05-15 DIAGNOSIS — R41841 Cognitive communication deficit: Secondary | ICD-10-CM | POA: Diagnosis not present

## 2018-05-15 DIAGNOSIS — R2681 Unsteadiness on feet: Secondary | ICD-10-CM | POA: Diagnosis not present

## 2018-05-16 DIAGNOSIS — R2681 Unsteadiness on feet: Secondary | ICD-10-CM | POA: Diagnosis not present

## 2018-05-16 DIAGNOSIS — M25552 Pain in left hip: Secondary | ICD-10-CM | POA: Diagnosis not present

## 2018-05-16 DIAGNOSIS — R4701 Aphasia: Secondary | ICD-10-CM | POA: Diagnosis not present

## 2018-05-16 DIAGNOSIS — M6281 Muscle weakness (generalized): Secondary | ICD-10-CM | POA: Diagnosis not present

## 2018-05-16 DIAGNOSIS — R41841 Cognitive communication deficit: Secondary | ICD-10-CM | POA: Diagnosis not present

## 2018-05-19 DIAGNOSIS — R41841 Cognitive communication deficit: Secondary | ICD-10-CM | POA: Diagnosis not present

## 2018-05-19 DIAGNOSIS — M25552 Pain in left hip: Secondary | ICD-10-CM | POA: Diagnosis not present

## 2018-05-19 DIAGNOSIS — R2681 Unsteadiness on feet: Secondary | ICD-10-CM | POA: Diagnosis not present

## 2018-05-19 DIAGNOSIS — R4701 Aphasia: Secondary | ICD-10-CM | POA: Diagnosis not present

## 2018-05-19 DIAGNOSIS — M6281 Muscle weakness (generalized): Secondary | ICD-10-CM | POA: Diagnosis not present

## 2018-05-20 DIAGNOSIS — M25552 Pain in left hip: Secondary | ICD-10-CM | POA: Diagnosis not present

## 2018-05-20 DIAGNOSIS — R2681 Unsteadiness on feet: Secondary | ICD-10-CM | POA: Diagnosis not present

## 2018-05-20 DIAGNOSIS — R41841 Cognitive communication deficit: Secondary | ICD-10-CM | POA: Diagnosis not present

## 2018-05-20 DIAGNOSIS — R4701 Aphasia: Secondary | ICD-10-CM | POA: Diagnosis not present

## 2018-05-20 DIAGNOSIS — M6281 Muscle weakness (generalized): Secondary | ICD-10-CM | POA: Diagnosis not present

## 2018-05-21 DIAGNOSIS — M25552 Pain in left hip: Secondary | ICD-10-CM | POA: Diagnosis not present

## 2018-05-21 DIAGNOSIS — M6281 Muscle weakness (generalized): Secondary | ICD-10-CM | POA: Diagnosis not present

## 2018-05-21 DIAGNOSIS — R41841 Cognitive communication deficit: Secondary | ICD-10-CM | POA: Diagnosis not present

## 2018-05-21 DIAGNOSIS — R2681 Unsteadiness on feet: Secondary | ICD-10-CM | POA: Diagnosis not present

## 2018-05-21 DIAGNOSIS — R4701 Aphasia: Secondary | ICD-10-CM | POA: Diagnosis not present

## 2018-05-22 DIAGNOSIS — M25552 Pain in left hip: Secondary | ICD-10-CM | POA: Diagnosis not present

## 2018-05-22 DIAGNOSIS — R2681 Unsteadiness on feet: Secondary | ICD-10-CM | POA: Diagnosis not present

## 2018-05-22 DIAGNOSIS — R41841 Cognitive communication deficit: Secondary | ICD-10-CM | POA: Diagnosis not present

## 2018-05-22 DIAGNOSIS — R4701 Aphasia: Secondary | ICD-10-CM | POA: Diagnosis not present

## 2018-05-22 DIAGNOSIS — M6281 Muscle weakness (generalized): Secondary | ICD-10-CM | POA: Diagnosis not present

## 2018-05-25 DIAGNOSIS — R4701 Aphasia: Secondary | ICD-10-CM | POA: Diagnosis not present

## 2018-05-25 DIAGNOSIS — R2681 Unsteadiness on feet: Secondary | ICD-10-CM | POA: Diagnosis not present

## 2018-05-25 DIAGNOSIS — R41841 Cognitive communication deficit: Secondary | ICD-10-CM | POA: Diagnosis not present

## 2018-05-25 DIAGNOSIS — M25552 Pain in left hip: Secondary | ICD-10-CM | POA: Diagnosis not present

## 2018-05-25 DIAGNOSIS — M6281 Muscle weakness (generalized): Secondary | ICD-10-CM | POA: Diagnosis not present

## 2018-05-26 DIAGNOSIS — R2681 Unsteadiness on feet: Secondary | ICD-10-CM | POA: Diagnosis not present

## 2018-05-26 DIAGNOSIS — R41841 Cognitive communication deficit: Secondary | ICD-10-CM | POA: Diagnosis not present

## 2018-05-26 DIAGNOSIS — R4701 Aphasia: Secondary | ICD-10-CM | POA: Diagnosis not present

## 2018-05-26 DIAGNOSIS — M6281 Muscle weakness (generalized): Secondary | ICD-10-CM | POA: Diagnosis not present

## 2018-05-26 DIAGNOSIS — M25552 Pain in left hip: Secondary | ICD-10-CM | POA: Diagnosis not present

## 2018-05-27 DIAGNOSIS — M25552 Pain in left hip: Secondary | ICD-10-CM | POA: Diagnosis not present

## 2018-05-27 DIAGNOSIS — R2681 Unsteadiness on feet: Secondary | ICD-10-CM | POA: Diagnosis not present

## 2018-05-27 DIAGNOSIS — R4701 Aphasia: Secondary | ICD-10-CM | POA: Diagnosis not present

## 2018-05-27 DIAGNOSIS — R41841 Cognitive communication deficit: Secondary | ICD-10-CM | POA: Diagnosis not present

## 2018-05-27 DIAGNOSIS — M6281 Muscle weakness (generalized): Secondary | ICD-10-CM | POA: Diagnosis not present

## 2018-05-28 DIAGNOSIS — R2681 Unsteadiness on feet: Secondary | ICD-10-CM | POA: Diagnosis not present

## 2018-05-28 DIAGNOSIS — M6281 Muscle weakness (generalized): Secondary | ICD-10-CM | POA: Diagnosis not present

## 2018-05-28 DIAGNOSIS — R4701 Aphasia: Secondary | ICD-10-CM | POA: Diagnosis not present

## 2018-05-28 DIAGNOSIS — M25552 Pain in left hip: Secondary | ICD-10-CM | POA: Diagnosis not present

## 2018-05-28 DIAGNOSIS — R41841 Cognitive communication deficit: Secondary | ICD-10-CM | POA: Diagnosis not present

## 2018-05-30 DIAGNOSIS — R2681 Unsteadiness on feet: Secondary | ICD-10-CM | POA: Diagnosis not present

## 2018-05-30 DIAGNOSIS — M6281 Muscle weakness (generalized): Secondary | ICD-10-CM | POA: Diagnosis not present

## 2018-05-30 DIAGNOSIS — R41841 Cognitive communication deficit: Secondary | ICD-10-CM | POA: Diagnosis not present

## 2018-05-30 DIAGNOSIS — M25552 Pain in left hip: Secondary | ICD-10-CM | POA: Diagnosis not present

## 2018-05-30 DIAGNOSIS — R4701 Aphasia: Secondary | ICD-10-CM | POA: Diagnosis not present

## 2018-06-02 DIAGNOSIS — R4701 Aphasia: Secondary | ICD-10-CM | POA: Diagnosis not present

## 2018-06-02 DIAGNOSIS — M6281 Muscle weakness (generalized): Secondary | ICD-10-CM | POA: Diagnosis not present

## 2018-06-02 DIAGNOSIS — R41841 Cognitive communication deficit: Secondary | ICD-10-CM | POA: Diagnosis not present

## 2018-06-02 DIAGNOSIS — M25552 Pain in left hip: Secondary | ICD-10-CM | POA: Diagnosis not present

## 2018-06-02 DIAGNOSIS — R2681 Unsteadiness on feet: Secondary | ICD-10-CM | POA: Diagnosis not present

## 2018-06-03 DIAGNOSIS — R4701 Aphasia: Secondary | ICD-10-CM | POA: Diagnosis not present

## 2018-06-03 DIAGNOSIS — R2681 Unsteadiness on feet: Secondary | ICD-10-CM | POA: Diagnosis not present

## 2018-06-03 DIAGNOSIS — M6281 Muscle weakness (generalized): Secondary | ICD-10-CM | POA: Diagnosis not present

## 2018-06-03 DIAGNOSIS — M25552 Pain in left hip: Secondary | ICD-10-CM | POA: Diagnosis not present

## 2018-06-03 DIAGNOSIS — R41841 Cognitive communication deficit: Secondary | ICD-10-CM | POA: Diagnosis not present

## 2018-06-04 DIAGNOSIS — M25552 Pain in left hip: Secondary | ICD-10-CM | POA: Diagnosis not present

## 2018-06-04 DIAGNOSIS — R41841 Cognitive communication deficit: Secondary | ICD-10-CM | POA: Diagnosis not present

## 2018-06-04 DIAGNOSIS — R4701 Aphasia: Secondary | ICD-10-CM | POA: Diagnosis not present

## 2018-06-04 DIAGNOSIS — M6281 Muscle weakness (generalized): Secondary | ICD-10-CM | POA: Diagnosis not present

## 2018-06-04 DIAGNOSIS — R2681 Unsteadiness on feet: Secondary | ICD-10-CM | POA: Diagnosis not present

## 2018-06-05 DIAGNOSIS — R2681 Unsteadiness on feet: Secondary | ICD-10-CM | POA: Diagnosis not present

## 2018-06-05 DIAGNOSIS — M6281 Muscle weakness (generalized): Secondary | ICD-10-CM | POA: Diagnosis not present

## 2018-06-05 DIAGNOSIS — M25552 Pain in left hip: Secondary | ICD-10-CM | POA: Diagnosis not present

## 2018-06-05 DIAGNOSIS — R41841 Cognitive communication deficit: Secondary | ICD-10-CM | POA: Diagnosis not present

## 2018-06-05 DIAGNOSIS — R4701 Aphasia: Secondary | ICD-10-CM | POA: Diagnosis not present

## 2018-06-06 DIAGNOSIS — R2681 Unsteadiness on feet: Secondary | ICD-10-CM | POA: Diagnosis not present

## 2018-06-06 DIAGNOSIS — M25552 Pain in left hip: Secondary | ICD-10-CM | POA: Diagnosis not present

## 2018-06-06 DIAGNOSIS — R41841 Cognitive communication deficit: Secondary | ICD-10-CM | POA: Diagnosis not present

## 2018-06-06 DIAGNOSIS — M6281 Muscle weakness (generalized): Secondary | ICD-10-CM | POA: Diagnosis not present

## 2018-06-06 DIAGNOSIS — R4701 Aphasia: Secondary | ICD-10-CM | POA: Diagnosis not present

## 2018-06-09 DIAGNOSIS — R2681 Unsteadiness on feet: Secondary | ICD-10-CM | POA: Diagnosis not present

## 2018-06-09 DIAGNOSIS — M6281 Muscle weakness (generalized): Secondary | ICD-10-CM | POA: Diagnosis not present

## 2018-06-09 DIAGNOSIS — R4701 Aphasia: Secondary | ICD-10-CM | POA: Diagnosis not present

## 2018-06-09 DIAGNOSIS — M25552 Pain in left hip: Secondary | ICD-10-CM | POA: Diagnosis not present

## 2018-06-09 DIAGNOSIS — R41841 Cognitive communication deficit: Secondary | ICD-10-CM | POA: Diagnosis not present

## 2018-06-10 DIAGNOSIS — R2681 Unsteadiness on feet: Secondary | ICD-10-CM | POA: Diagnosis not present

## 2018-06-10 DIAGNOSIS — M6281 Muscle weakness (generalized): Secondary | ICD-10-CM | POA: Diagnosis not present

## 2018-06-10 DIAGNOSIS — M25552 Pain in left hip: Secondary | ICD-10-CM | POA: Diagnosis not present

## 2018-06-10 DIAGNOSIS — R4701 Aphasia: Secondary | ICD-10-CM | POA: Diagnosis not present

## 2018-06-10 DIAGNOSIS — R41841 Cognitive communication deficit: Secondary | ICD-10-CM | POA: Diagnosis not present

## 2018-06-11 DIAGNOSIS — R4701 Aphasia: Secondary | ICD-10-CM | POA: Diagnosis not present

## 2018-06-11 DIAGNOSIS — M25552 Pain in left hip: Secondary | ICD-10-CM | POA: Diagnosis not present

## 2018-06-11 DIAGNOSIS — M6281 Muscle weakness (generalized): Secondary | ICD-10-CM | POA: Diagnosis not present

## 2018-06-11 DIAGNOSIS — R41841 Cognitive communication deficit: Secondary | ICD-10-CM | POA: Diagnosis not present

## 2018-06-11 DIAGNOSIS — R2681 Unsteadiness on feet: Secondary | ICD-10-CM | POA: Diagnosis not present

## 2018-06-12 DIAGNOSIS — R41841 Cognitive communication deficit: Secondary | ICD-10-CM | POA: Diagnosis not present

## 2018-06-12 DIAGNOSIS — R4701 Aphasia: Secondary | ICD-10-CM | POA: Diagnosis not present

## 2018-06-12 DIAGNOSIS — M25552 Pain in left hip: Secondary | ICD-10-CM | POA: Diagnosis not present

## 2018-06-12 DIAGNOSIS — R2681 Unsteadiness on feet: Secondary | ICD-10-CM | POA: Diagnosis not present

## 2018-06-12 DIAGNOSIS — M6281 Muscle weakness (generalized): Secondary | ICD-10-CM | POA: Diagnosis not present

## 2018-06-13 DIAGNOSIS — R41841 Cognitive communication deficit: Secondary | ICD-10-CM | POA: Diagnosis not present

## 2018-06-13 DIAGNOSIS — R4701 Aphasia: Secondary | ICD-10-CM | POA: Diagnosis not present

## 2018-06-13 DIAGNOSIS — R2681 Unsteadiness on feet: Secondary | ICD-10-CM | POA: Diagnosis not present

## 2018-06-13 DIAGNOSIS — M25552 Pain in left hip: Secondary | ICD-10-CM | POA: Diagnosis not present

## 2018-06-13 DIAGNOSIS — M6281 Muscle weakness (generalized): Secondary | ICD-10-CM | POA: Diagnosis not present

## 2018-06-16 DIAGNOSIS — R2681 Unsteadiness on feet: Secondary | ICD-10-CM | POA: Diagnosis not present

## 2018-06-16 DIAGNOSIS — R41841 Cognitive communication deficit: Secondary | ICD-10-CM | POA: Diagnosis not present

## 2018-06-16 DIAGNOSIS — M6281 Muscle weakness (generalized): Secondary | ICD-10-CM | POA: Diagnosis not present

## 2018-06-16 DIAGNOSIS — M25552 Pain in left hip: Secondary | ICD-10-CM | POA: Diagnosis not present

## 2018-06-16 DIAGNOSIS — R4701 Aphasia: Secondary | ICD-10-CM | POA: Diagnosis not present

## 2018-06-17 DIAGNOSIS — R4701 Aphasia: Secondary | ICD-10-CM | POA: Diagnosis not present

## 2018-06-17 DIAGNOSIS — M6281 Muscle weakness (generalized): Secondary | ICD-10-CM | POA: Diagnosis not present

## 2018-06-17 DIAGNOSIS — M25552 Pain in left hip: Secondary | ICD-10-CM | POA: Diagnosis not present

## 2018-06-17 DIAGNOSIS — R41841 Cognitive communication deficit: Secondary | ICD-10-CM | POA: Diagnosis not present

## 2018-06-17 DIAGNOSIS — R2681 Unsteadiness on feet: Secondary | ICD-10-CM | POA: Diagnosis not present

## 2018-06-18 DIAGNOSIS — R41841 Cognitive communication deficit: Secondary | ICD-10-CM | POA: Diagnosis not present

## 2018-06-18 DIAGNOSIS — M6281 Muscle weakness (generalized): Secondary | ICD-10-CM | POA: Diagnosis not present

## 2018-06-18 DIAGNOSIS — R4701 Aphasia: Secondary | ICD-10-CM | POA: Diagnosis not present

## 2018-06-18 DIAGNOSIS — R2681 Unsteadiness on feet: Secondary | ICD-10-CM | POA: Diagnosis not present

## 2018-06-18 DIAGNOSIS — M25552 Pain in left hip: Secondary | ICD-10-CM | POA: Diagnosis not present

## 2018-06-19 DIAGNOSIS — R2681 Unsteadiness on feet: Secondary | ICD-10-CM | POA: Diagnosis not present

## 2018-06-19 DIAGNOSIS — R4701 Aphasia: Secondary | ICD-10-CM | POA: Diagnosis not present

## 2018-06-19 DIAGNOSIS — M6281 Muscle weakness (generalized): Secondary | ICD-10-CM | POA: Diagnosis not present

## 2018-06-19 DIAGNOSIS — R41841 Cognitive communication deficit: Secondary | ICD-10-CM | POA: Diagnosis not present

## 2018-06-19 DIAGNOSIS — M25552 Pain in left hip: Secondary | ICD-10-CM | POA: Diagnosis not present

## 2018-06-20 DIAGNOSIS — M25552 Pain in left hip: Secondary | ICD-10-CM | POA: Diagnosis not present

## 2018-06-20 DIAGNOSIS — R41841 Cognitive communication deficit: Secondary | ICD-10-CM | POA: Diagnosis not present

## 2018-06-20 DIAGNOSIS — M6281 Muscle weakness (generalized): Secondary | ICD-10-CM | POA: Diagnosis not present

## 2018-06-20 DIAGNOSIS — R2681 Unsteadiness on feet: Secondary | ICD-10-CM | POA: Diagnosis not present

## 2018-06-20 DIAGNOSIS — R4701 Aphasia: Secondary | ICD-10-CM | POA: Diagnosis not present

## 2018-06-23 DIAGNOSIS — M25552 Pain in left hip: Secondary | ICD-10-CM | POA: Diagnosis not present

## 2018-06-23 DIAGNOSIS — M6281 Muscle weakness (generalized): Secondary | ICD-10-CM | POA: Diagnosis not present

## 2018-06-23 DIAGNOSIS — R41841 Cognitive communication deficit: Secondary | ICD-10-CM | POA: Diagnosis not present

## 2018-06-23 DIAGNOSIS — R4701 Aphasia: Secondary | ICD-10-CM | POA: Diagnosis not present

## 2018-06-23 DIAGNOSIS — R2681 Unsteadiness on feet: Secondary | ICD-10-CM | POA: Diagnosis not present

## 2018-06-24 DIAGNOSIS — R2681 Unsteadiness on feet: Secondary | ICD-10-CM | POA: Diagnosis not present

## 2018-06-24 DIAGNOSIS — M6281 Muscle weakness (generalized): Secondary | ICD-10-CM | POA: Diagnosis not present

## 2018-06-24 DIAGNOSIS — R41841 Cognitive communication deficit: Secondary | ICD-10-CM | POA: Diagnosis not present

## 2018-06-24 DIAGNOSIS — R4701 Aphasia: Secondary | ICD-10-CM | POA: Diagnosis not present

## 2018-06-24 DIAGNOSIS — M25552 Pain in left hip: Secondary | ICD-10-CM | POA: Diagnosis not present

## 2018-06-26 DIAGNOSIS — M25552 Pain in left hip: Secondary | ICD-10-CM | POA: Diagnosis not present

## 2018-06-26 DIAGNOSIS — R4701 Aphasia: Secondary | ICD-10-CM | POA: Diagnosis not present

## 2018-06-26 DIAGNOSIS — R41841 Cognitive communication deficit: Secondary | ICD-10-CM | POA: Diagnosis not present

## 2018-06-26 DIAGNOSIS — R2681 Unsteadiness on feet: Secondary | ICD-10-CM | POA: Diagnosis not present

## 2018-06-26 DIAGNOSIS — M6281 Muscle weakness (generalized): Secondary | ICD-10-CM | POA: Diagnosis not present

## 2018-06-30 DIAGNOSIS — R41841 Cognitive communication deficit: Secondary | ICD-10-CM | POA: Diagnosis not present

## 2018-06-30 DIAGNOSIS — R2681 Unsteadiness on feet: Secondary | ICD-10-CM | POA: Diagnosis not present

## 2018-06-30 DIAGNOSIS — R4701 Aphasia: Secondary | ICD-10-CM | POA: Diagnosis not present

## 2018-06-30 DIAGNOSIS — M6281 Muscle weakness (generalized): Secondary | ICD-10-CM | POA: Diagnosis not present

## 2018-06-30 DIAGNOSIS — M25552 Pain in left hip: Secondary | ICD-10-CM | POA: Diagnosis not present

## 2018-07-01 DIAGNOSIS — M25552 Pain in left hip: Secondary | ICD-10-CM | POA: Diagnosis not present

## 2018-07-01 DIAGNOSIS — R2681 Unsteadiness on feet: Secondary | ICD-10-CM | POA: Diagnosis not present

## 2018-07-01 DIAGNOSIS — R41841 Cognitive communication deficit: Secondary | ICD-10-CM | POA: Diagnosis not present

## 2018-07-01 DIAGNOSIS — M6281 Muscle weakness (generalized): Secondary | ICD-10-CM | POA: Diagnosis not present

## 2018-07-01 DIAGNOSIS — R4701 Aphasia: Secondary | ICD-10-CM | POA: Diagnosis not present

## 2018-07-02 DIAGNOSIS — M6281 Muscle weakness (generalized): Secondary | ICD-10-CM | POA: Diagnosis not present

## 2018-07-02 DIAGNOSIS — R4701 Aphasia: Secondary | ICD-10-CM | POA: Diagnosis not present

## 2018-07-02 DIAGNOSIS — M25552 Pain in left hip: Secondary | ICD-10-CM | POA: Diagnosis not present

## 2018-07-02 DIAGNOSIS — R41841 Cognitive communication deficit: Secondary | ICD-10-CM | POA: Diagnosis not present

## 2018-07-02 DIAGNOSIS — R2681 Unsteadiness on feet: Secondary | ICD-10-CM | POA: Diagnosis not present

## 2018-07-03 DIAGNOSIS — R2681 Unsteadiness on feet: Secondary | ICD-10-CM | POA: Diagnosis not present

## 2018-07-03 DIAGNOSIS — M25552 Pain in left hip: Secondary | ICD-10-CM | POA: Diagnosis not present

## 2018-07-03 DIAGNOSIS — M6281 Muscle weakness (generalized): Secondary | ICD-10-CM | POA: Diagnosis not present

## 2018-07-03 DIAGNOSIS — R4701 Aphasia: Secondary | ICD-10-CM | POA: Diagnosis not present

## 2018-07-03 DIAGNOSIS — R41841 Cognitive communication deficit: Secondary | ICD-10-CM | POA: Diagnosis not present

## 2018-07-07 DIAGNOSIS — R2681 Unsteadiness on feet: Secondary | ICD-10-CM | POA: Diagnosis not present

## 2018-07-07 DIAGNOSIS — M6281 Muscle weakness (generalized): Secondary | ICD-10-CM | POA: Diagnosis not present

## 2018-07-07 DIAGNOSIS — R41841 Cognitive communication deficit: Secondary | ICD-10-CM | POA: Diagnosis not present

## 2018-07-07 DIAGNOSIS — M25552 Pain in left hip: Secondary | ICD-10-CM | POA: Diagnosis not present

## 2018-07-07 DIAGNOSIS — R4701 Aphasia: Secondary | ICD-10-CM | POA: Diagnosis not present

## 2018-07-08 DIAGNOSIS — R41841 Cognitive communication deficit: Secondary | ICD-10-CM | POA: Diagnosis not present

## 2018-07-08 DIAGNOSIS — M25552 Pain in left hip: Secondary | ICD-10-CM | POA: Diagnosis not present

## 2018-07-08 DIAGNOSIS — R2681 Unsteadiness on feet: Secondary | ICD-10-CM | POA: Diagnosis not present

## 2018-07-08 DIAGNOSIS — R4701 Aphasia: Secondary | ICD-10-CM | POA: Diagnosis not present

## 2018-07-08 DIAGNOSIS — M6281 Muscle weakness (generalized): Secondary | ICD-10-CM | POA: Diagnosis not present

## 2018-07-09 DIAGNOSIS — R41841 Cognitive communication deficit: Secondary | ICD-10-CM | POA: Diagnosis not present

## 2018-07-09 DIAGNOSIS — R4701 Aphasia: Secondary | ICD-10-CM | POA: Diagnosis not present

## 2018-07-09 DIAGNOSIS — R2681 Unsteadiness on feet: Secondary | ICD-10-CM | POA: Diagnosis not present

## 2018-07-09 DIAGNOSIS — M25552 Pain in left hip: Secondary | ICD-10-CM | POA: Diagnosis not present

## 2018-07-09 DIAGNOSIS — M6281 Muscle weakness (generalized): Secondary | ICD-10-CM | POA: Diagnosis not present

## 2018-07-10 DIAGNOSIS — M25552 Pain in left hip: Secondary | ICD-10-CM | POA: Diagnosis not present

## 2018-07-10 DIAGNOSIS — R4701 Aphasia: Secondary | ICD-10-CM | POA: Diagnosis not present

## 2018-07-10 DIAGNOSIS — R41841 Cognitive communication deficit: Secondary | ICD-10-CM | POA: Diagnosis not present

## 2018-07-10 DIAGNOSIS — R2681 Unsteadiness on feet: Secondary | ICD-10-CM | POA: Diagnosis not present

## 2018-07-10 DIAGNOSIS — M6281 Muscle weakness (generalized): Secondary | ICD-10-CM | POA: Diagnosis not present

## 2018-07-11 DIAGNOSIS — M25552 Pain in left hip: Secondary | ICD-10-CM | POA: Diagnosis not present

## 2018-07-11 DIAGNOSIS — R41841 Cognitive communication deficit: Secondary | ICD-10-CM | POA: Diagnosis not present

## 2018-07-11 DIAGNOSIS — R2681 Unsteadiness on feet: Secondary | ICD-10-CM | POA: Diagnosis not present

## 2018-07-11 DIAGNOSIS — M6281 Muscle weakness (generalized): Secondary | ICD-10-CM | POA: Diagnosis not present

## 2018-07-11 DIAGNOSIS — R4701 Aphasia: Secondary | ICD-10-CM | POA: Diagnosis not present

## 2018-07-14 DIAGNOSIS — R4701 Aphasia: Secondary | ICD-10-CM | POA: Diagnosis not present

## 2018-07-14 DIAGNOSIS — M6281 Muscle weakness (generalized): Secondary | ICD-10-CM | POA: Diagnosis not present

## 2018-07-14 DIAGNOSIS — R2681 Unsteadiness on feet: Secondary | ICD-10-CM | POA: Diagnosis not present

## 2018-07-14 DIAGNOSIS — M25552 Pain in left hip: Secondary | ICD-10-CM | POA: Diagnosis not present

## 2018-07-14 DIAGNOSIS — R41841 Cognitive communication deficit: Secondary | ICD-10-CM | POA: Diagnosis not present

## 2018-07-15 DIAGNOSIS — M25552 Pain in left hip: Secondary | ICD-10-CM | POA: Diagnosis not present

## 2018-07-15 DIAGNOSIS — R4701 Aphasia: Secondary | ICD-10-CM | POA: Diagnosis not present

## 2018-07-15 DIAGNOSIS — M6281 Muscle weakness (generalized): Secondary | ICD-10-CM | POA: Diagnosis not present

## 2018-07-15 DIAGNOSIS — R41841 Cognitive communication deficit: Secondary | ICD-10-CM | POA: Diagnosis not present

## 2018-07-15 DIAGNOSIS — R2681 Unsteadiness on feet: Secondary | ICD-10-CM | POA: Diagnosis not present

## 2018-07-16 DIAGNOSIS — R2681 Unsteadiness on feet: Secondary | ICD-10-CM | POA: Diagnosis not present

## 2018-07-16 DIAGNOSIS — M6281 Muscle weakness (generalized): Secondary | ICD-10-CM | POA: Diagnosis not present

## 2018-07-16 DIAGNOSIS — M25552 Pain in left hip: Secondary | ICD-10-CM | POA: Diagnosis not present

## 2018-07-16 DIAGNOSIS — R4701 Aphasia: Secondary | ICD-10-CM | POA: Diagnosis not present

## 2018-07-16 DIAGNOSIS — R41841 Cognitive communication deficit: Secondary | ICD-10-CM | POA: Diagnosis not present

## 2018-07-17 DIAGNOSIS — R4701 Aphasia: Secondary | ICD-10-CM | POA: Diagnosis not present

## 2018-07-17 DIAGNOSIS — R2681 Unsteadiness on feet: Secondary | ICD-10-CM | POA: Diagnosis not present

## 2018-07-17 DIAGNOSIS — M25552 Pain in left hip: Secondary | ICD-10-CM | POA: Diagnosis not present

## 2018-07-17 DIAGNOSIS — R41841 Cognitive communication deficit: Secondary | ICD-10-CM | POA: Diagnosis not present

## 2018-07-17 DIAGNOSIS — M6281 Muscle weakness (generalized): Secondary | ICD-10-CM | POA: Diagnosis not present

## 2018-07-18 DIAGNOSIS — M6281 Muscle weakness (generalized): Secondary | ICD-10-CM | POA: Diagnosis not present

## 2018-07-18 DIAGNOSIS — R4701 Aphasia: Secondary | ICD-10-CM | POA: Diagnosis not present

## 2018-07-18 DIAGNOSIS — M25552 Pain in left hip: Secondary | ICD-10-CM | POA: Diagnosis not present

## 2018-07-18 DIAGNOSIS — R41841 Cognitive communication deficit: Secondary | ICD-10-CM | POA: Diagnosis not present

## 2018-07-18 DIAGNOSIS — R2681 Unsteadiness on feet: Secondary | ICD-10-CM | POA: Diagnosis not present

## 2018-07-18 IMAGING — CT CT HEAD W/O CM
3 series · 15 of 47 positions shown, 18 images · non-contrast
Comparison: 12/22/2014

CLINICAL DATA: near syncopal episode. Dr. Fofanah called stating
that patient may have had a heart attack and is concerned about
patient's ekg. Patient's wife states patient has had syncopal
episodes in past but was never as pale and diaphoretic. Patient has
history of dementia.

EXAM:
CT HEAD WITHOUT CONTRAST
TECHNIQUE: Contiguous axial images were obtained from the base of the skull
through the vertex without intravenous contrast.

[Series 2: head wo · axial · 0.47mm/px · z∈[+249,+384]mm · 9 of 33 slices shown, 12 images]
[im 3/33  brain]
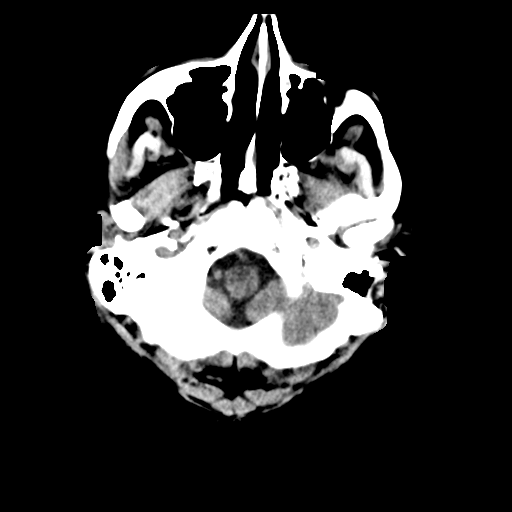
[im 3/33  bone]
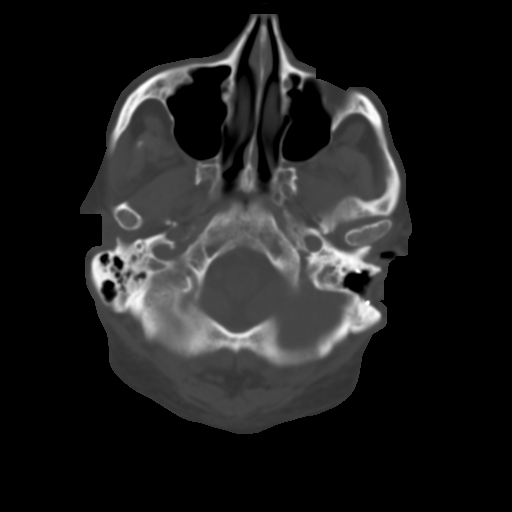
[im 6/33  brain]
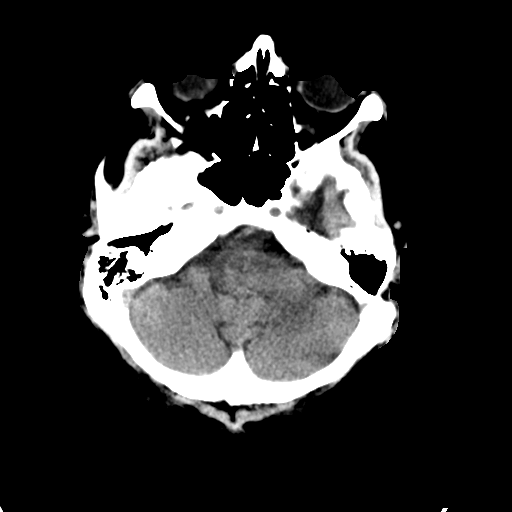
[im 9/33  brain]
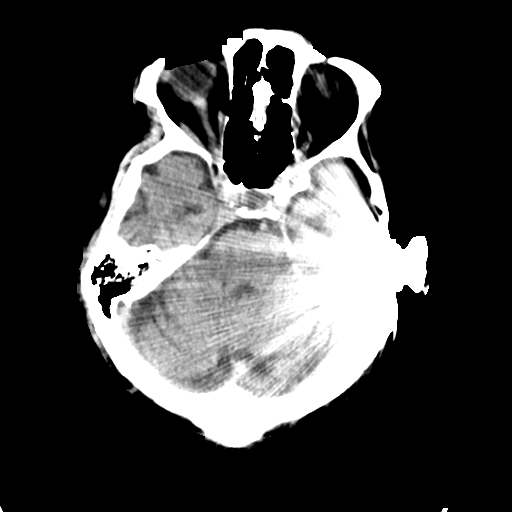
[im 13/33  brain]
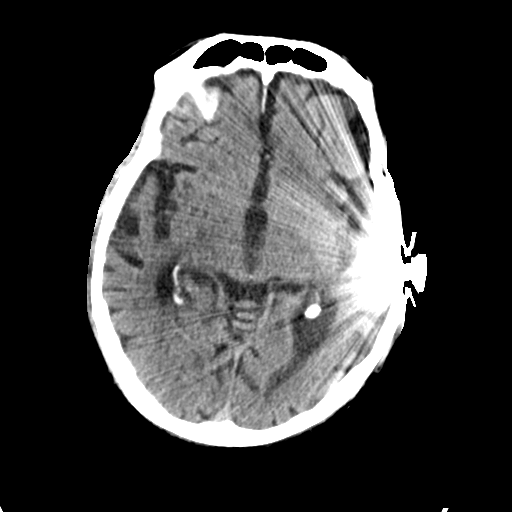
[im 17/33  brain]
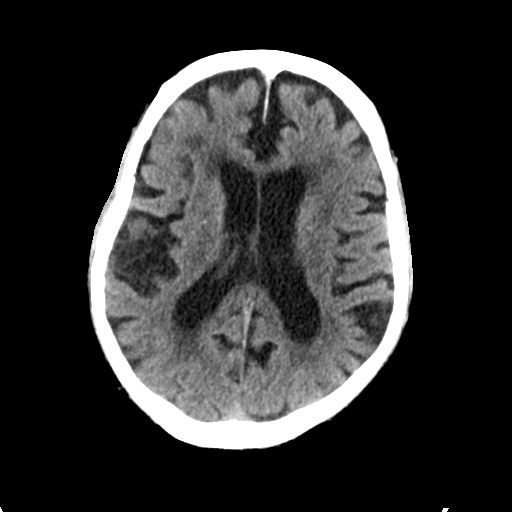
[im 17/33  bone]
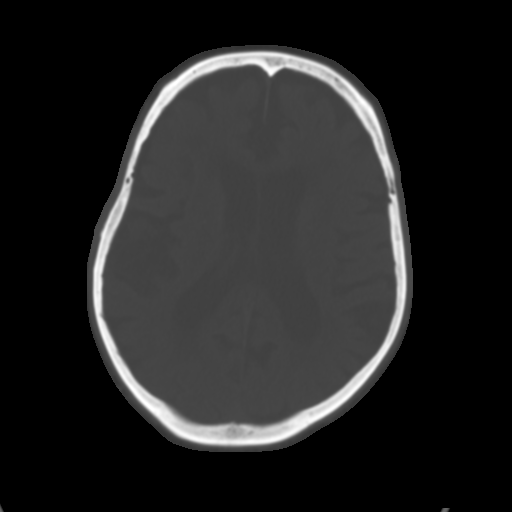
[im 20/33  brain]
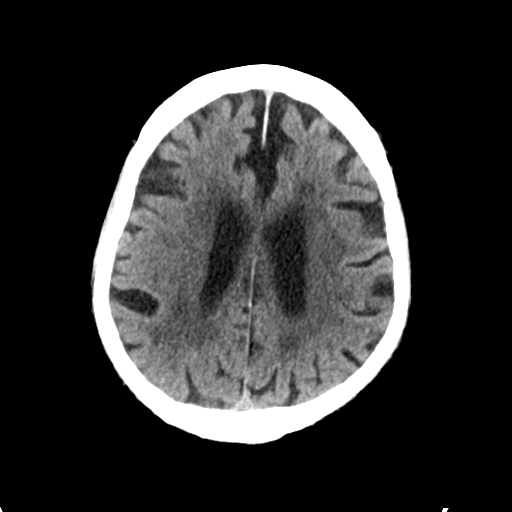
[im 24/33  brain]
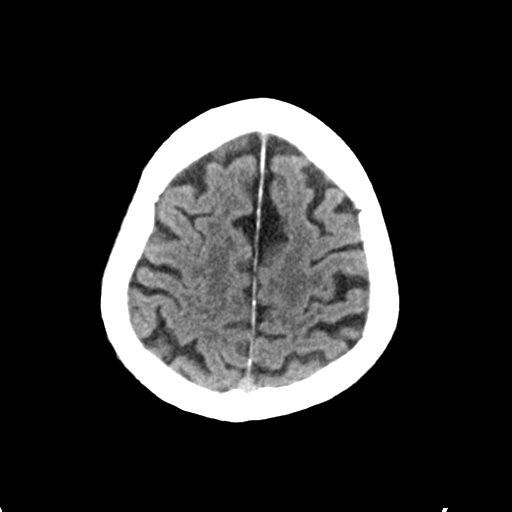
[im 27/33  brain]
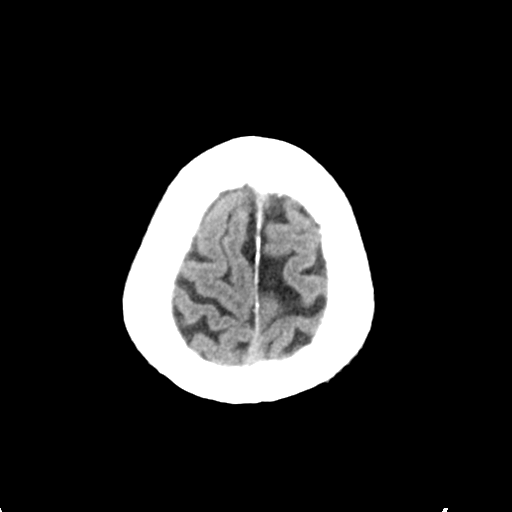
[im 30/33  brain]
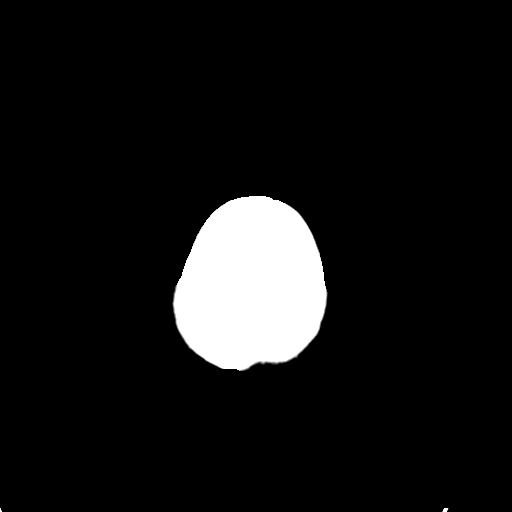
[im 30/33  bone]
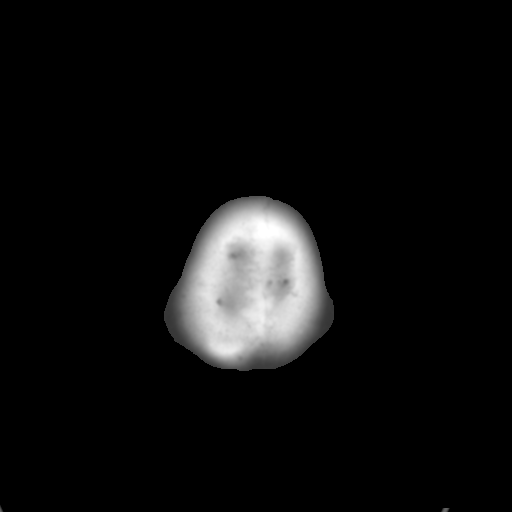

[Series 4: coronal soft tissue · coronal · 0.36mm/px · 3 of 73 slices shown]
[im 25/73  brain]
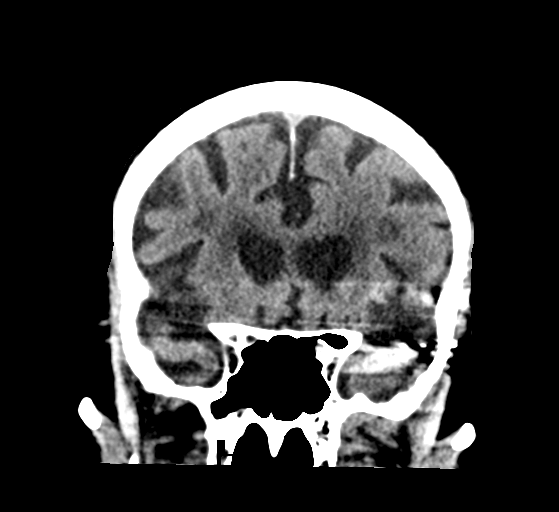
[im 33/73  brain]
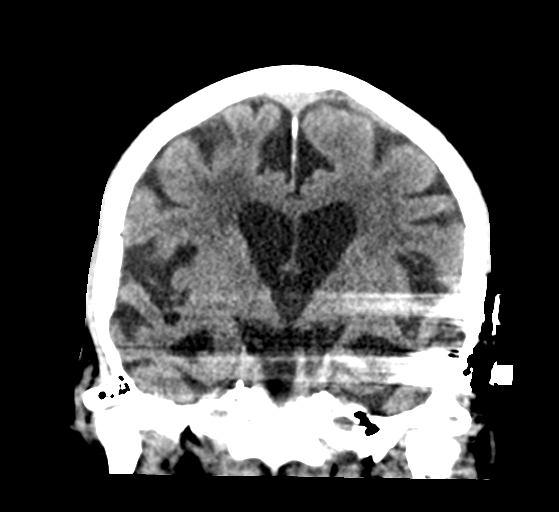
[im 41/73  brain]
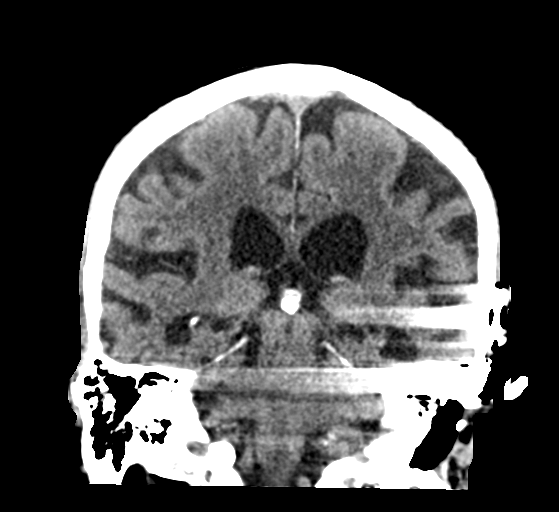

[Series 5: sagittal soft tissue · sagittal · 0.34mm/px · 3 of 67 slices shown]
[im 23/67  brain]
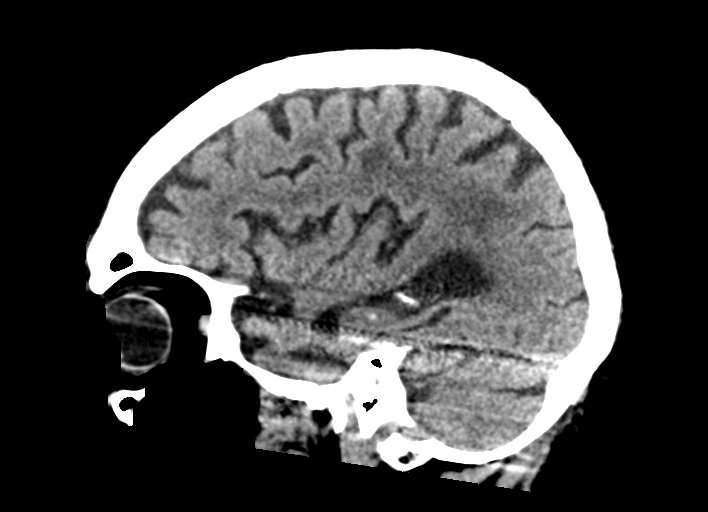
[im 34/67  brain]
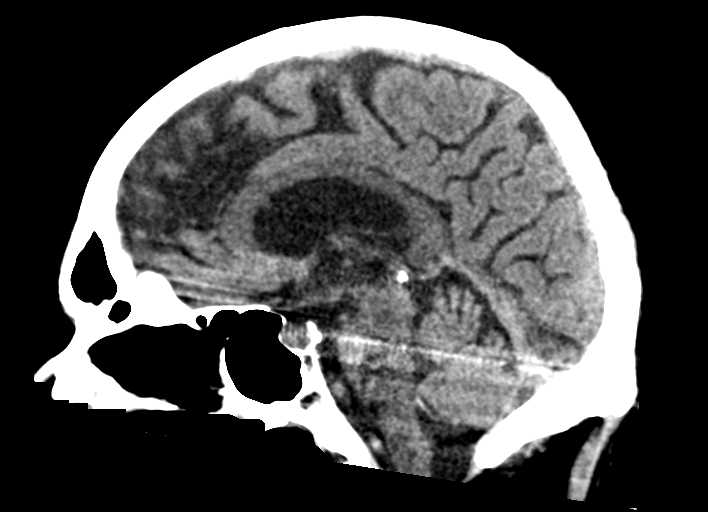
[im 45/67  brain]
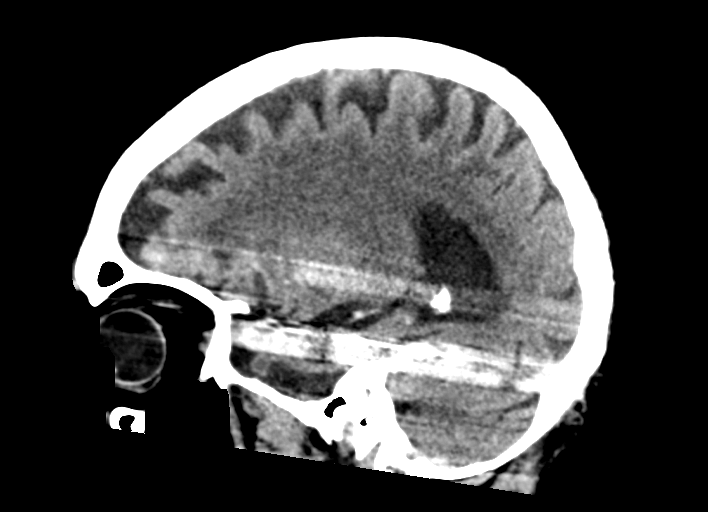

[15 of 47 positions shown; findings below may reference images not displayed]

FINDINGS: Brain: Streak artifact from left cochlear implant. Diffuse
parenchymal atrophy. Patchy areas of hypoattenuation in deep and
periventricular white matter bilaterally. Negative for acute
intracranial hemorrhage, mass lesion, acute infarction, midline
shift, or mass-effect. Acute infarct may be inapparent on
noncontrast CT. Ventricles and sulci symmetric.

Vascular: No hyperdense vessel or unexpected calcification.

Skull: Postop change in the left mastoid with cochlear implant. No
acute fracture.

Sinuses/Orbits: No acute finding.

Other: None.
IMPRESSION: 1. Negative for bleed or other acute intracranial process.
2. Atrophy and nonspecific white matter changes.

## 2018-07-21 DIAGNOSIS — R4701 Aphasia: Secondary | ICD-10-CM | POA: Diagnosis not present

## 2018-07-21 DIAGNOSIS — M6281 Muscle weakness (generalized): Secondary | ICD-10-CM | POA: Diagnosis not present

## 2018-07-21 DIAGNOSIS — M25552 Pain in left hip: Secondary | ICD-10-CM | POA: Diagnosis not present

## 2018-07-21 DIAGNOSIS — R41841 Cognitive communication deficit: Secondary | ICD-10-CM | POA: Diagnosis not present

## 2018-07-21 DIAGNOSIS — R2681 Unsteadiness on feet: Secondary | ICD-10-CM | POA: Diagnosis not present

## 2018-07-22 DIAGNOSIS — R4701 Aphasia: Secondary | ICD-10-CM | POA: Diagnosis not present

## 2018-07-22 DIAGNOSIS — R2681 Unsteadiness on feet: Secondary | ICD-10-CM | POA: Diagnosis not present

## 2018-07-22 DIAGNOSIS — M25552 Pain in left hip: Secondary | ICD-10-CM | POA: Diagnosis not present

## 2018-07-22 DIAGNOSIS — M6281 Muscle weakness (generalized): Secondary | ICD-10-CM | POA: Diagnosis not present

## 2018-07-22 DIAGNOSIS — R41841 Cognitive communication deficit: Secondary | ICD-10-CM | POA: Diagnosis not present

## 2018-07-23 DIAGNOSIS — R2681 Unsteadiness on feet: Secondary | ICD-10-CM | POA: Diagnosis not present

## 2018-07-23 DIAGNOSIS — R41841 Cognitive communication deficit: Secondary | ICD-10-CM | POA: Diagnosis not present

## 2018-07-23 DIAGNOSIS — R4701 Aphasia: Secondary | ICD-10-CM | POA: Diagnosis not present

## 2018-07-23 DIAGNOSIS — M25552 Pain in left hip: Secondary | ICD-10-CM | POA: Diagnosis not present

## 2018-07-23 DIAGNOSIS — M6281 Muscle weakness (generalized): Secondary | ICD-10-CM | POA: Diagnosis not present

## 2018-09-01 DIAGNOSIS — F0280 Dementia in other diseases classified elsewhere without behavioral disturbance: Secondary | ICD-10-CM | POA: Diagnosis not present

## 2018-09-01 DIAGNOSIS — N189 Chronic kidney disease, unspecified: Secondary | ICD-10-CM | POA: Diagnosis not present

## 2018-09-01 DIAGNOSIS — N401 Enlarged prostate with lower urinary tract symptoms: Secondary | ICD-10-CM | POA: Diagnosis not present

## 2018-09-01 DIAGNOSIS — F329 Major depressive disorder, single episode, unspecified: Secondary | ICD-10-CM | POA: Diagnosis not present

## 2018-09-01 DIAGNOSIS — E782 Mixed hyperlipidemia: Secondary | ICD-10-CM | POA: Diagnosis not present

## 2018-09-01 DIAGNOSIS — E039 Hypothyroidism, unspecified: Secondary | ICD-10-CM | POA: Diagnosis not present

## 2018-09-01 DIAGNOSIS — G894 Chronic pain syndrome: Secondary | ICD-10-CM | POA: Diagnosis not present

## 2018-09-01 DIAGNOSIS — R351 Nocturia: Secondary | ICD-10-CM | POA: Diagnosis not present

## 2018-09-01 DIAGNOSIS — E875 Hyperkalemia: Secondary | ICD-10-CM | POA: Diagnosis not present

## 2018-09-01 DIAGNOSIS — G301 Alzheimer's disease with late onset: Secondary | ICD-10-CM | POA: Diagnosis not present

## 2018-09-01 DIAGNOSIS — M150 Primary generalized (osteo)arthritis: Secondary | ICD-10-CM | POA: Diagnosis not present

## 2018-09-03 DIAGNOSIS — R451 Restlessness and agitation: Secondary | ICD-10-CM | POA: Diagnosis not present

## 2018-09-03 DIAGNOSIS — G894 Chronic pain syndrome: Secondary | ICD-10-CM | POA: Diagnosis not present

## 2018-09-03 DIAGNOSIS — Z79899 Other long term (current) drug therapy: Secondary | ICD-10-CM | POA: Diagnosis not present

## 2018-09-03 DIAGNOSIS — G301 Alzheimer's disease with late onset: Secondary | ICD-10-CM | POA: Diagnosis not present

## 2018-09-03 DIAGNOSIS — N401 Enlarged prostate with lower urinary tract symptoms: Secondary | ICD-10-CM | POA: Diagnosis not present

## 2018-09-03 DIAGNOSIS — M150 Primary generalized (osteo)arthritis: Secondary | ICD-10-CM | POA: Diagnosis not present

## 2018-09-05 DIAGNOSIS — R05 Cough: Secondary | ICD-10-CM | POA: Diagnosis not present

## 2018-09-05 DIAGNOSIS — F0391 Unspecified dementia with behavioral disturbance: Secondary | ICD-10-CM | POA: Diagnosis not present

## 2018-09-05 DIAGNOSIS — G301 Alzheimer's disease with late onset: Secondary | ICD-10-CM | POA: Diagnosis not present

## 2018-09-05 DIAGNOSIS — N3000 Acute cystitis without hematuria: Secondary | ICD-10-CM | POA: Diagnosis not present

## 2018-09-11 DIAGNOSIS — L84 Corns and callosities: Secondary | ICD-10-CM | POA: Diagnosis not present

## 2018-09-11 DIAGNOSIS — I739 Peripheral vascular disease, unspecified: Secondary | ICD-10-CM | POA: Diagnosis not present

## 2018-09-11 DIAGNOSIS — R609 Edema, unspecified: Secondary | ICD-10-CM | POA: Diagnosis not present

## 2018-09-11 DIAGNOSIS — B351 Tinea unguium: Secondary | ICD-10-CM | POA: Diagnosis not present

## 2018-09-15 DIAGNOSIS — F329 Major depressive disorder, single episode, unspecified: Secondary | ICD-10-CM | POA: Diagnosis not present

## 2018-09-15 DIAGNOSIS — N3000 Acute cystitis without hematuria: Secondary | ICD-10-CM | POA: Diagnosis not present

## 2018-09-15 DIAGNOSIS — F0280 Dementia in other diseases classified elsewhere without behavioral disturbance: Secondary | ICD-10-CM | POA: Diagnosis not present

## 2018-09-26 DIAGNOSIS — R634 Abnormal weight loss: Secondary | ICD-10-CM | POA: Diagnosis not present

## 2018-09-26 DIAGNOSIS — F0391 Unspecified dementia with behavioral disturbance: Secondary | ICD-10-CM | POA: Diagnosis not present

## 2018-09-26 DIAGNOSIS — F329 Major depressive disorder, single episode, unspecified: Secondary | ICD-10-CM | POA: Diagnosis not present

## 2018-09-30 DIAGNOSIS — N390 Urinary tract infection, site not specified: Secondary | ICD-10-CM | POA: Diagnosis not present

## 2018-09-30 DIAGNOSIS — F0391 Unspecified dementia with behavioral disturbance: Secondary | ICD-10-CM | POA: Diagnosis not present

## 2018-09-30 DIAGNOSIS — F0281 Dementia in other diseases classified elsewhere with behavioral disturbance: Secondary | ICD-10-CM | POA: Diagnosis not present

## 2018-09-30 DIAGNOSIS — E860 Dehydration: Secondary | ICD-10-CM | POA: Diagnosis not present

## 2018-09-30 DIAGNOSIS — E039 Hypothyroidism, unspecified: Secondary | ICD-10-CM | POA: Diagnosis not present

## 2018-09-30 DIAGNOSIS — R4689 Other symptoms and signs involving appearance and behavior: Secondary | ICD-10-CM | POA: Diagnosis not present

## 2018-09-30 DIAGNOSIS — G309 Alzheimer's disease, unspecified: Secondary | ICD-10-CM | POA: Diagnosis not present

## 2018-09-30 DIAGNOSIS — D649 Anemia, unspecified: Secondary | ICD-10-CM | POA: Diagnosis not present

## 2018-10-01 DIAGNOSIS — F99 Mental disorder, not otherwise specified: Secondary | ICD-10-CM | POA: Diagnosis not present

## 2018-10-01 DIAGNOSIS — E039 Hypothyroidism, unspecified: Secondary | ICD-10-CM | POA: Diagnosis present

## 2018-10-01 DIAGNOSIS — R5381 Other malaise: Secondary | ICD-10-CM | POA: Diagnosis not present

## 2018-10-01 DIAGNOSIS — N401 Enlarged prostate with lower urinary tract symptoms: Secondary | ICD-10-CM | POA: Diagnosis present

## 2018-10-01 DIAGNOSIS — D649 Anemia, unspecified: Secondary | ICD-10-CM | POA: Diagnosis present

## 2018-10-01 DIAGNOSIS — F0281 Dementia in other diseases classified elsewhere with behavioral disturbance: Secondary | ICD-10-CM | POA: Diagnosis present

## 2018-10-01 DIAGNOSIS — M6281 Muscle weakness (generalized): Secondary | ICD-10-CM | POA: Diagnosis not present

## 2018-10-01 DIAGNOSIS — F0391 Unspecified dementia with behavioral disturbance: Secondary | ICD-10-CM | POA: Diagnosis not present

## 2018-10-01 DIAGNOSIS — Z66 Do not resuscitate: Secondary | ICD-10-CM | POA: Diagnosis present

## 2018-10-01 DIAGNOSIS — E7800 Pure hypercholesterolemia, unspecified: Secondary | ICD-10-CM | POA: Diagnosis not present

## 2018-10-01 DIAGNOSIS — E785 Hyperlipidemia, unspecified: Secondary | ICD-10-CM | POA: Diagnosis present

## 2018-10-01 DIAGNOSIS — E860 Dehydration: Secondary | ICD-10-CM | POA: Diagnosis present

## 2018-10-01 DIAGNOSIS — N183 Chronic kidney disease, stage 3 (moderate): Secondary | ICD-10-CM | POA: Diagnosis present

## 2018-10-01 DIAGNOSIS — F329 Major depressive disorder, single episode, unspecified: Secondary | ICD-10-CM | POA: Diagnosis present

## 2018-10-01 DIAGNOSIS — N390 Urinary tract infection, site not specified: Secondary | ICD-10-CM | POA: Diagnosis present

## 2018-10-01 DIAGNOSIS — Z87891 Personal history of nicotine dependence: Secondary | ICD-10-CM | POA: Diagnosis not present

## 2018-10-01 DIAGNOSIS — Z87442 Personal history of urinary calculi: Secondary | ICD-10-CM | POA: Diagnosis not present

## 2018-10-01 DIAGNOSIS — R279 Unspecified lack of coordination: Secondary | ICD-10-CM | POA: Diagnosis not present

## 2018-10-01 DIAGNOSIS — Z781 Physical restraint status: Secondary | ICD-10-CM | POA: Diagnosis not present

## 2018-10-01 DIAGNOSIS — H9193 Unspecified hearing loss, bilateral: Secondary | ICD-10-CM | POA: Diagnosis present

## 2018-10-01 DIAGNOSIS — I129 Hypertensive chronic kidney disease with stage 1 through stage 4 chronic kidney disease, or unspecified chronic kidney disease: Secondary | ICD-10-CM | POA: Diagnosis present

## 2018-10-01 DIAGNOSIS — Z803 Family history of malignant neoplasm of breast: Secondary | ICD-10-CM | POA: Diagnosis not present

## 2018-10-01 DIAGNOSIS — G309 Alzheimer's disease, unspecified: Secondary | ICD-10-CM | POA: Diagnosis present

## 2018-10-01 DIAGNOSIS — R4689 Other symptoms and signs involving appearance and behavior: Secondary | ICD-10-CM | POA: Diagnosis not present

## 2018-10-01 DIAGNOSIS — Z20828 Contact with and (suspected) exposure to other viral communicable diseases: Secondary | ICD-10-CM | POA: Diagnosis present

## 2018-10-01 DIAGNOSIS — Z87440 Personal history of urinary (tract) infections: Secondary | ICD-10-CM | POA: Diagnosis not present

## 2018-10-01 DIAGNOSIS — N3000 Acute cystitis without hematuria: Secondary | ICD-10-CM | POA: Diagnosis not present

## 2018-10-06 DIAGNOSIS — R451 Restlessness and agitation: Secondary | ICD-10-CM | POA: Diagnosis not present

## 2018-10-06 DIAGNOSIS — F329 Major depressive disorder, single episode, unspecified: Secondary | ICD-10-CM | POA: Diagnosis not present

## 2018-10-06 DIAGNOSIS — F0391 Unspecified dementia with behavioral disturbance: Secondary | ICD-10-CM | POA: Diagnosis not present

## 2018-10-06 DIAGNOSIS — N3000 Acute cystitis without hematuria: Secondary | ICD-10-CM | POA: Diagnosis not present

## 2018-10-08 DIAGNOSIS — R609 Edema, unspecified: Secondary | ICD-10-CM | POA: Diagnosis not present

## 2018-10-08 DIAGNOSIS — E875 Hyperkalemia: Secondary | ICD-10-CM | POA: Diagnosis not present

## 2018-10-08 DIAGNOSIS — Z79899 Other long term (current) drug therapy: Secondary | ICD-10-CM | POA: Diagnosis not present

## 2018-10-17 DIAGNOSIS — F0391 Unspecified dementia with behavioral disturbance: Secondary | ICD-10-CM | POA: Diagnosis not present

## 2018-10-17 DIAGNOSIS — F329 Major depressive disorder, single episode, unspecified: Secondary | ICD-10-CM | POA: Diagnosis not present

## 2018-10-22 DIAGNOSIS — F0391 Unspecified dementia with behavioral disturbance: Secondary | ICD-10-CM | POA: Diagnosis not present

## 2018-10-22 DIAGNOSIS — F59 Unspecified behavioral syndromes associated with physiological disturbances and physical factors: Secondary | ICD-10-CM | POA: Diagnosis not present

## 2018-10-23 DIAGNOSIS — Z79899 Other long term (current) drug therapy: Secondary | ICD-10-CM | POA: Diagnosis not present

## 2018-10-27 DIAGNOSIS — F0391 Unspecified dementia with behavioral disturbance: Secondary | ICD-10-CM | POA: Diagnosis not present

## 2018-10-27 DIAGNOSIS — F329 Major depressive disorder, single episode, unspecified: Secondary | ICD-10-CM | POA: Diagnosis not present

## 2018-10-27 DIAGNOSIS — E039 Hypothyroidism, unspecified: Secondary | ICD-10-CM | POA: Diagnosis not present

## 2018-10-29 DIAGNOSIS — K5900 Constipation, unspecified: Secondary | ICD-10-CM | POA: Diagnosis present

## 2018-10-29 DIAGNOSIS — I10 Essential (primary) hypertension: Secondary | ICD-10-CM | POA: Diagnosis present

## 2018-10-29 DIAGNOSIS — R5381 Other malaise: Secondary | ICD-10-CM | POA: Diagnosis not present

## 2018-10-29 DIAGNOSIS — R8271 Bacteriuria: Secondary | ICD-10-CM | POA: Diagnosis not present

## 2018-10-29 DIAGNOSIS — E538 Deficiency of other specified B group vitamins: Secondary | ICD-10-CM | POA: Diagnosis not present

## 2018-10-29 DIAGNOSIS — E785 Hyperlipidemia, unspecified: Secondary | ICD-10-CM | POA: Diagnosis present

## 2018-10-29 DIAGNOSIS — F05 Delirium due to known physiological condition: Secondary | ICD-10-CM | POA: Diagnosis present

## 2018-10-29 DIAGNOSIS — Z1159 Encounter for screening for other viral diseases: Secondary | ICD-10-CM | POA: Diagnosis not present

## 2018-10-29 DIAGNOSIS — R410 Disorientation, unspecified: Secondary | ICD-10-CM | POA: Diagnosis not present

## 2018-10-29 DIAGNOSIS — B952 Enterococcus as the cause of diseases classified elsewhere: Secondary | ICD-10-CM | POA: Diagnosis not present

## 2018-10-29 DIAGNOSIS — I517 Cardiomegaly: Secondary | ICD-10-CM | POA: Diagnosis not present

## 2018-10-29 DIAGNOSIS — Z781 Physical restraint status: Secondary | ICD-10-CM | POA: Diagnosis not present

## 2018-10-29 DIAGNOSIS — I872 Venous insufficiency (chronic) (peripheral): Secondary | ICD-10-CM | POA: Diagnosis present

## 2018-10-29 DIAGNOSIS — R1032 Left lower quadrant pain: Secondary | ICD-10-CM | POA: Diagnosis not present

## 2018-10-29 DIAGNOSIS — R8281 Pyuria: Secondary | ICD-10-CM | POA: Diagnosis not present

## 2018-10-29 DIAGNOSIS — E039 Hypothyroidism, unspecified: Secondary | ICD-10-CM | POA: Diagnosis not present

## 2018-10-29 DIAGNOSIS — N401 Enlarged prostate with lower urinary tract symptoms: Secondary | ICD-10-CM | POA: Diagnosis not present

## 2018-10-29 DIAGNOSIS — R32 Unspecified urinary incontinence: Secondary | ICD-10-CM | POA: Diagnosis not present

## 2018-10-29 DIAGNOSIS — R279 Unspecified lack of coordination: Secondary | ICD-10-CM | POA: Diagnosis not present

## 2018-10-29 DIAGNOSIS — R1031 Right lower quadrant pain: Secondary | ICD-10-CM | POA: Diagnosis not present

## 2018-10-29 DIAGNOSIS — R9082 White matter disease, unspecified: Secondary | ICD-10-CM | POA: Diagnosis not present

## 2018-10-29 DIAGNOSIS — R52 Pain, unspecified: Secondary | ICD-10-CM | POA: Diagnosis not present

## 2018-10-29 DIAGNOSIS — M6281 Muscle weakness (generalized): Secondary | ICD-10-CM | POA: Diagnosis not present

## 2018-10-29 DIAGNOSIS — Z87440 Personal history of urinary (tract) infections: Secondary | ICD-10-CM | POA: Diagnosis not present

## 2018-10-29 DIAGNOSIS — R2243 Localized swelling, mass and lump, lower limb, bilateral: Secondary | ICD-10-CM | POA: Diagnosis not present

## 2018-10-29 DIAGNOSIS — Z87891 Personal history of nicotine dependence: Secondary | ICD-10-CM | POA: Diagnosis not present

## 2018-10-29 DIAGNOSIS — R4182 Altered mental status, unspecified: Secondary | ICD-10-CM | POA: Diagnosis not present

## 2018-10-29 DIAGNOSIS — M7989 Other specified soft tissue disorders: Secondary | ICD-10-CM | POA: Diagnosis not present

## 2018-10-29 DIAGNOSIS — F0391 Unspecified dementia with behavioral disturbance: Secondary | ICD-10-CM | POA: Diagnosis not present

## 2018-10-29 DIAGNOSIS — Z66 Do not resuscitate: Secondary | ICD-10-CM | POA: Diagnosis present

## 2018-10-29 DIAGNOSIS — N390 Urinary tract infection, site not specified: Secondary | ICD-10-CM | POA: Diagnosis not present

## 2018-10-29 DIAGNOSIS — F329 Major depressive disorder, single episode, unspecified: Secondary | ICD-10-CM | POA: Diagnosis present

## 2018-11-03 DIAGNOSIS — F0391 Unspecified dementia with behavioral disturbance: Secondary | ICD-10-CM | POA: Diagnosis not present

## 2018-11-03 DIAGNOSIS — N3000 Acute cystitis without hematuria: Secondary | ICD-10-CM | POA: Diagnosis not present

## 2018-11-05 DIAGNOSIS — H903 Sensorineural hearing loss, bilateral: Secondary | ICD-10-CM | POA: Diagnosis not present

## 2018-11-07 DIAGNOSIS — S41109A Unspecified open wound of unspecified upper arm, initial encounter: Secondary | ICD-10-CM | POA: Diagnosis not present

## 2018-11-07 DIAGNOSIS — F0391 Unspecified dementia with behavioral disturbance: Secondary | ICD-10-CM | POA: Diagnosis not present

## 2018-11-10 DIAGNOSIS — W19XXXA Unspecified fall, initial encounter: Secondary | ICD-10-CM | POA: Diagnosis not present

## 2018-11-10 DIAGNOSIS — F0391 Unspecified dementia with behavioral disturbance: Secondary | ICD-10-CM | POA: Diagnosis not present

## 2018-11-10 DIAGNOSIS — R451 Restlessness and agitation: Secondary | ICD-10-CM | POA: Diagnosis not present

## 2018-11-10 DIAGNOSIS — N401 Enlarged prostate with lower urinary tract symptoms: Secondary | ICD-10-CM | POA: Diagnosis not present

## 2018-11-17 DIAGNOSIS — F0391 Unspecified dementia with behavioral disturbance: Secondary | ICD-10-CM | POA: Diagnosis not present

## 2018-11-17 DIAGNOSIS — F329 Major depressive disorder, single episode, unspecified: Secondary | ICD-10-CM | POA: Diagnosis not present

## 2018-11-17 DIAGNOSIS — N401 Enlarged prostate with lower urinary tract symptoms: Secondary | ICD-10-CM | POA: Diagnosis not present

## 2018-11-17 DIAGNOSIS — E782 Mixed hyperlipidemia: Secondary | ICD-10-CM | POA: Diagnosis not present

## 2018-11-17 DIAGNOSIS — M150 Primary generalized (osteo)arthritis: Secondary | ICD-10-CM | POA: Diagnosis not present

## 2018-11-17 DIAGNOSIS — E039 Hypothyroidism, unspecified: Secondary | ICD-10-CM | POA: Diagnosis not present

## 2018-11-17 DIAGNOSIS — N390 Urinary tract infection, site not specified: Secondary | ICD-10-CM | POA: Diagnosis not present

## 2018-11-19 DIAGNOSIS — F0391 Unspecified dementia with behavioral disturbance: Secondary | ICD-10-CM | POA: Diagnosis not present

## 2018-11-19 DIAGNOSIS — F59 Unspecified behavioral syndromes associated with physiological disturbances and physical factors: Secondary | ICD-10-CM | POA: Diagnosis not present

## 2018-11-20 DIAGNOSIS — R609 Edema, unspecified: Secondary | ICD-10-CM | POA: Diagnosis not present

## 2018-11-20 DIAGNOSIS — B351 Tinea unguium: Secondary | ICD-10-CM | POA: Diagnosis not present

## 2018-11-20 DIAGNOSIS — L84 Corns and callosities: Secondary | ICD-10-CM | POA: Diagnosis not present

## 2018-11-20 DIAGNOSIS — I739 Peripheral vascular disease, unspecified: Secondary | ICD-10-CM | POA: Diagnosis not present

## 2018-11-28 DIAGNOSIS — F0391 Unspecified dementia with behavioral disturbance: Secondary | ICD-10-CM | POA: Diagnosis not present

## 2018-11-28 DIAGNOSIS — N390 Urinary tract infection, site not specified: Secondary | ICD-10-CM | POA: Diagnosis not present

## 2018-11-28 DIAGNOSIS — G894 Chronic pain syndrome: Secondary | ICD-10-CM | POA: Diagnosis not present

## 2018-12-10 DIAGNOSIS — N390 Urinary tract infection, site not specified: Secondary | ICD-10-CM | POA: Diagnosis not present

## 2018-12-15 DIAGNOSIS — F0391 Unspecified dementia with behavioral disturbance: Secondary | ICD-10-CM | POA: Diagnosis not present

## 2018-12-15 DIAGNOSIS — G894 Chronic pain syndrome: Secondary | ICD-10-CM | POA: Diagnosis not present

## 2018-12-15 DIAGNOSIS — N390 Urinary tract infection, site not specified: Secondary | ICD-10-CM | POA: Diagnosis not present

## 2018-12-15 DIAGNOSIS — M158 Other polyosteoarthritis: Secondary | ICD-10-CM | POA: Diagnosis not present

## 2018-12-17 DIAGNOSIS — M158 Other polyosteoarthritis: Secondary | ICD-10-CM | POA: Diagnosis not present

## 2018-12-17 DIAGNOSIS — F59 Unspecified behavioral syndromes associated with physiological disturbances and physical factors: Secondary | ICD-10-CM | POA: Diagnosis not present

## 2018-12-17 DIAGNOSIS — F0391 Unspecified dementia with behavioral disturbance: Secondary | ICD-10-CM | POA: Diagnosis not present

## 2018-12-17 DIAGNOSIS — G894 Chronic pain syndrome: Secondary | ICD-10-CM | POA: Diagnosis not present

## 2018-12-17 DIAGNOSIS — R609 Edema, unspecified: Secondary | ICD-10-CM | POA: Diagnosis not present

## 2018-12-19 DIAGNOSIS — E782 Mixed hyperlipidemia: Secondary | ICD-10-CM | POA: Diagnosis not present

## 2018-12-19 DIAGNOSIS — M150 Primary generalized (osteo)arthritis: Secondary | ICD-10-CM | POA: Diagnosis not present

## 2018-12-19 DIAGNOSIS — G301 Alzheimer's disease with late onset: Secondary | ICD-10-CM | POA: Diagnosis not present

## 2018-12-19 DIAGNOSIS — N390 Urinary tract infection, site not specified: Secondary | ICD-10-CM | POA: Diagnosis not present

## 2018-12-19 DIAGNOSIS — G894 Chronic pain syndrome: Secondary | ICD-10-CM | POA: Diagnosis not present

## 2018-12-19 DIAGNOSIS — N401 Enlarged prostate with lower urinary tract symptoms: Secondary | ICD-10-CM | POA: Diagnosis not present

## 2018-12-19 DIAGNOSIS — E039 Hypothyroidism, unspecified: Secondary | ICD-10-CM | POA: Diagnosis not present

## 2018-12-19 DIAGNOSIS — F329 Major depressive disorder, single episode, unspecified: Secondary | ICD-10-CM | POA: Diagnosis not present

## 2018-12-26 DIAGNOSIS — F0391 Unspecified dementia with behavioral disturbance: Secondary | ICD-10-CM | POA: Diagnosis not present

## 2018-12-26 DIAGNOSIS — F329 Major depressive disorder, single episode, unspecified: Secondary | ICD-10-CM | POA: Diagnosis not present

## 2018-12-26 DIAGNOSIS — R451 Restlessness and agitation: Secondary | ICD-10-CM | POA: Diagnosis not present

## 2019-01-09 DIAGNOSIS — F329 Major depressive disorder, single episode, unspecified: Secondary | ICD-10-CM | POA: Diagnosis not present

## 2019-01-09 DIAGNOSIS — N390 Urinary tract infection, site not specified: Secondary | ICD-10-CM | POA: Diagnosis not present

## 2019-01-09 DIAGNOSIS — F0391 Unspecified dementia with behavioral disturbance: Secondary | ICD-10-CM | POA: Diagnosis not present

## 2019-01-10 DIAGNOSIS — N390 Urinary tract infection, site not specified: Secondary | ICD-10-CM | POA: Diagnosis not present

## 2019-01-14 DIAGNOSIS — F59 Unspecified behavioral syndromes associated with physiological disturbances and physical factors: Secondary | ICD-10-CM | POA: Diagnosis not present

## 2019-01-14 DIAGNOSIS — F0391 Unspecified dementia with behavioral disturbance: Secondary | ICD-10-CM | POA: Diagnosis not present

## 2019-01-23 DIAGNOSIS — F0391 Unspecified dementia with behavioral disturbance: Secondary | ICD-10-CM | POA: Diagnosis not present

## 2019-01-23 DIAGNOSIS — W19XXXA Unspecified fall, initial encounter: Secondary | ICD-10-CM | POA: Diagnosis not present

## 2019-01-23 DIAGNOSIS — R4689 Other symptoms and signs involving appearance and behavior: Secondary | ICD-10-CM | POA: Diagnosis not present

## 2019-01-23 DIAGNOSIS — N390 Urinary tract infection, site not specified: Secondary | ICD-10-CM | POA: Diagnosis not present

## 2019-02-02 ENCOUNTER — Other Ambulatory Visit: Payer: Self-pay

## 2019-02-04 DIAGNOSIS — R4689 Other symptoms and signs involving appearance and behavior: Secondary | ICD-10-CM | POA: Diagnosis not present

## 2019-02-04 DIAGNOSIS — F329 Major depressive disorder, single episode, unspecified: Secondary | ICD-10-CM | POA: Diagnosis not present

## 2019-02-04 DIAGNOSIS — N390 Urinary tract infection, site not specified: Secondary | ICD-10-CM | POA: Diagnosis not present

## 2019-02-04 DIAGNOSIS — F0391 Unspecified dementia with behavioral disturbance: Secondary | ICD-10-CM | POA: Diagnosis not present

## 2019-02-05 DIAGNOSIS — I739 Peripheral vascular disease, unspecified: Secondary | ICD-10-CM | POA: Diagnosis not present

## 2019-02-05 DIAGNOSIS — M150 Primary generalized (osteo)arthritis: Secondary | ICD-10-CM | POA: Diagnosis not present

## 2019-02-05 DIAGNOSIS — B351 Tinea unguium: Secondary | ICD-10-CM | POA: Diagnosis not present

## 2019-02-05 DIAGNOSIS — L84 Corns and callosities: Secondary | ICD-10-CM | POA: Diagnosis not present

## 2019-02-05 DIAGNOSIS — R609 Edema, unspecified: Secondary | ICD-10-CM | POA: Diagnosis not present

## 2019-02-06 DIAGNOSIS — N390 Urinary tract infection, site not specified: Secondary | ICD-10-CM | POA: Diagnosis not present

## 2019-02-07 DIAGNOSIS — D649 Anemia, unspecified: Secondary | ICD-10-CM | POA: Diagnosis not present

## 2019-02-09 DIAGNOSIS — D649 Anemia, unspecified: Secondary | ICD-10-CM | POA: Diagnosis not present

## 2019-02-09 DIAGNOSIS — F0391 Unspecified dementia with behavioral disturbance: Secondary | ICD-10-CM | POA: Diagnosis not present

## 2019-02-18 DIAGNOSIS — F0391 Unspecified dementia with behavioral disturbance: Secondary | ICD-10-CM | POA: Diagnosis not present

## 2019-02-18 DIAGNOSIS — M158 Other polyosteoarthritis: Secondary | ICD-10-CM | POA: Diagnosis not present

## 2019-02-18 DIAGNOSIS — E7849 Other hyperlipidemia: Secondary | ICD-10-CM | POA: Diagnosis not present

## 2019-02-18 DIAGNOSIS — G894 Chronic pain syndrome: Secondary | ICD-10-CM | POA: Diagnosis not present

## 2019-02-18 DIAGNOSIS — R609 Edema, unspecified: Secondary | ICD-10-CM | POA: Diagnosis not present

## 2019-02-20 DIAGNOSIS — M6281 Muscle weakness (generalized): Secondary | ICD-10-CM | POA: Diagnosis not present

## 2019-02-20 DIAGNOSIS — R278 Other lack of coordination: Secondary | ICD-10-CM | POA: Diagnosis not present

## 2019-02-20 DIAGNOSIS — R2681 Unsteadiness on feet: Secondary | ICD-10-CM | POA: Diagnosis not present

## 2019-02-23 DIAGNOSIS — M6281 Muscle weakness (generalized): Secondary | ICD-10-CM | POA: Diagnosis not present

## 2019-02-23 DIAGNOSIS — R2681 Unsteadiness on feet: Secondary | ICD-10-CM | POA: Diagnosis not present

## 2019-02-23 DIAGNOSIS — R278 Other lack of coordination: Secondary | ICD-10-CM | POA: Diagnosis not present

## 2019-02-24 DIAGNOSIS — M6281 Muscle weakness (generalized): Secondary | ICD-10-CM | POA: Diagnosis not present

## 2019-02-24 DIAGNOSIS — R2681 Unsteadiness on feet: Secondary | ICD-10-CM | POA: Diagnosis not present

## 2019-02-24 DIAGNOSIS — R278 Other lack of coordination: Secondary | ICD-10-CM | POA: Diagnosis not present

## 2019-02-25 DIAGNOSIS — F59 Unspecified behavioral syndromes associated with physiological disturbances and physical factors: Secondary | ICD-10-CM | POA: Diagnosis not present

## 2019-02-25 DIAGNOSIS — R278 Other lack of coordination: Secondary | ICD-10-CM | POA: Diagnosis not present

## 2019-02-25 DIAGNOSIS — F0391 Unspecified dementia with behavioral disturbance: Secondary | ICD-10-CM | POA: Diagnosis not present

## 2019-02-25 DIAGNOSIS — M6281 Muscle weakness (generalized): Secondary | ICD-10-CM | POA: Diagnosis not present

## 2019-02-25 DIAGNOSIS — R2681 Unsteadiness on feet: Secondary | ICD-10-CM | POA: Diagnosis not present

## 2019-02-27 DIAGNOSIS — M6281 Muscle weakness (generalized): Secondary | ICD-10-CM | POA: Diagnosis not present

## 2019-02-27 DIAGNOSIS — R278 Other lack of coordination: Secondary | ICD-10-CM | POA: Diagnosis not present

## 2019-02-27 DIAGNOSIS — R2681 Unsteadiness on feet: Secondary | ICD-10-CM | POA: Diagnosis not present

## 2019-03-02 DIAGNOSIS — G894 Chronic pain syndrome: Secondary | ICD-10-CM | POA: Diagnosis not present

## 2019-03-02 DIAGNOSIS — E782 Mixed hyperlipidemia: Secondary | ICD-10-CM | POA: Diagnosis not present

## 2019-03-02 DIAGNOSIS — M150 Primary generalized (osteo)arthritis: Secondary | ICD-10-CM | POA: Diagnosis not present

## 2019-03-02 DIAGNOSIS — F329 Major depressive disorder, single episode, unspecified: Secondary | ICD-10-CM | POA: Diagnosis not present

## 2019-03-02 DIAGNOSIS — E039 Hypothyroidism, unspecified: Secondary | ICD-10-CM | POA: Diagnosis not present

## 2019-03-02 DIAGNOSIS — F0391 Unspecified dementia with behavioral disturbance: Secondary | ICD-10-CM | POA: Diagnosis not present

## 2019-03-02 DIAGNOSIS — N390 Urinary tract infection, site not specified: Secondary | ICD-10-CM | POA: Diagnosis not present

## 2019-03-03 DIAGNOSIS — R2681 Unsteadiness on feet: Secondary | ICD-10-CM | POA: Diagnosis not present

## 2019-03-03 DIAGNOSIS — M6281 Muscle weakness (generalized): Secondary | ICD-10-CM | POA: Diagnosis not present

## 2019-03-03 DIAGNOSIS — R278 Other lack of coordination: Secondary | ICD-10-CM | POA: Diagnosis not present

## 2019-03-04 DIAGNOSIS — R2681 Unsteadiness on feet: Secondary | ICD-10-CM | POA: Diagnosis not present

## 2019-03-04 DIAGNOSIS — R278 Other lack of coordination: Secondary | ICD-10-CM | POA: Diagnosis not present

## 2019-03-04 DIAGNOSIS — M6281 Muscle weakness (generalized): Secondary | ICD-10-CM | POA: Diagnosis not present

## 2019-03-06 DIAGNOSIS — R278 Other lack of coordination: Secondary | ICD-10-CM | POA: Diagnosis not present

## 2019-03-06 DIAGNOSIS — M6281 Muscle weakness (generalized): Secondary | ICD-10-CM | POA: Diagnosis not present

## 2019-03-06 DIAGNOSIS — R2681 Unsteadiness on feet: Secondary | ICD-10-CM | POA: Diagnosis not present

## 2019-03-07 DIAGNOSIS — M6281 Muscle weakness (generalized): Secondary | ICD-10-CM | POA: Diagnosis not present

## 2019-03-07 DIAGNOSIS — R278 Other lack of coordination: Secondary | ICD-10-CM | POA: Diagnosis not present

## 2019-03-07 DIAGNOSIS — R2681 Unsteadiness on feet: Secondary | ICD-10-CM | POA: Diagnosis not present

## 2019-03-10 DIAGNOSIS — M6281 Muscle weakness (generalized): Secondary | ICD-10-CM | POA: Diagnosis not present

## 2019-03-10 DIAGNOSIS — R2681 Unsteadiness on feet: Secondary | ICD-10-CM | POA: Diagnosis not present

## 2019-03-10 DIAGNOSIS — R278 Other lack of coordination: Secondary | ICD-10-CM | POA: Diagnosis not present

## 2019-03-11 DIAGNOSIS — R2681 Unsteadiness on feet: Secondary | ICD-10-CM | POA: Diagnosis not present

## 2019-03-11 DIAGNOSIS — R278 Other lack of coordination: Secondary | ICD-10-CM | POA: Diagnosis not present

## 2019-03-11 DIAGNOSIS — M6281 Muscle weakness (generalized): Secondary | ICD-10-CM | POA: Diagnosis not present

## 2019-03-12 DIAGNOSIS — R2681 Unsteadiness on feet: Secondary | ICD-10-CM | POA: Diagnosis not present

## 2019-03-12 DIAGNOSIS — R278 Other lack of coordination: Secondary | ICD-10-CM | POA: Diagnosis not present

## 2019-03-12 DIAGNOSIS — M6281 Muscle weakness (generalized): Secondary | ICD-10-CM | POA: Diagnosis not present

## 2019-03-13 DIAGNOSIS — R2681 Unsteadiness on feet: Secondary | ICD-10-CM | POA: Diagnosis not present

## 2019-03-13 DIAGNOSIS — M6281 Muscle weakness (generalized): Secondary | ICD-10-CM | POA: Diagnosis not present

## 2019-03-13 DIAGNOSIS — R278 Other lack of coordination: Secondary | ICD-10-CM | POA: Diagnosis not present

## 2019-03-16 DIAGNOSIS — F0391 Unspecified dementia with behavioral disturbance: Secondary | ICD-10-CM | POA: Diagnosis not present

## 2019-03-16 DIAGNOSIS — R278 Other lack of coordination: Secondary | ICD-10-CM | POA: Diagnosis not present

## 2019-03-16 DIAGNOSIS — R2681 Unsteadiness on feet: Secondary | ICD-10-CM | POA: Diagnosis not present

## 2019-03-16 DIAGNOSIS — D508 Other iron deficiency anemias: Secondary | ICD-10-CM | POA: Diagnosis not present

## 2019-03-16 DIAGNOSIS — R4689 Other symptoms and signs involving appearance and behavior: Secondary | ICD-10-CM | POA: Diagnosis not present

## 2019-03-16 DIAGNOSIS — M6281 Muscle weakness (generalized): Secondary | ICD-10-CM | POA: Diagnosis not present

## 2019-03-17 DIAGNOSIS — M6281 Muscle weakness (generalized): Secondary | ICD-10-CM | POA: Diagnosis not present

## 2019-03-17 DIAGNOSIS — R278 Other lack of coordination: Secondary | ICD-10-CM | POA: Diagnosis not present

## 2019-03-17 DIAGNOSIS — R2681 Unsteadiness on feet: Secondary | ICD-10-CM | POA: Diagnosis not present

## 2019-03-18 DIAGNOSIS — R2681 Unsteadiness on feet: Secondary | ICD-10-CM | POA: Diagnosis not present

## 2019-03-18 DIAGNOSIS — R278 Other lack of coordination: Secondary | ICD-10-CM | POA: Diagnosis not present

## 2019-03-18 DIAGNOSIS — M6281 Muscle weakness (generalized): Secondary | ICD-10-CM | POA: Diagnosis not present

## 2019-03-19 DIAGNOSIS — R2681 Unsteadiness on feet: Secondary | ICD-10-CM | POA: Diagnosis not present

## 2019-03-19 DIAGNOSIS — M6281 Muscle weakness (generalized): Secondary | ICD-10-CM | POA: Diagnosis not present

## 2019-03-19 DIAGNOSIS — R278 Other lack of coordination: Secondary | ICD-10-CM | POA: Diagnosis not present

## 2019-03-20 DIAGNOSIS — G4700 Insomnia, unspecified: Secondary | ICD-10-CM | POA: Diagnosis not present

## 2019-03-20 DIAGNOSIS — F0391 Unspecified dementia with behavioral disturbance: Secondary | ICD-10-CM | POA: Diagnosis not present

## 2019-03-20 DIAGNOSIS — D508 Other iron deficiency anemias: Secondary | ICD-10-CM | POA: Diagnosis not present

## 2019-03-20 DIAGNOSIS — R4689 Other symptoms and signs involving appearance and behavior: Secondary | ICD-10-CM | POA: Diagnosis not present

## 2019-03-23 DIAGNOSIS — R2681 Unsteadiness on feet: Secondary | ICD-10-CM | POA: Diagnosis not present

## 2019-03-23 DIAGNOSIS — M6281 Muscle weakness (generalized): Secondary | ICD-10-CM | POA: Diagnosis not present

## 2019-03-23 DIAGNOSIS — R278 Other lack of coordination: Secondary | ICD-10-CM | POA: Diagnosis not present

## 2019-03-24 DIAGNOSIS — R278 Other lack of coordination: Secondary | ICD-10-CM | POA: Diagnosis not present

## 2019-03-24 DIAGNOSIS — R2681 Unsteadiness on feet: Secondary | ICD-10-CM | POA: Diagnosis not present

## 2019-03-24 DIAGNOSIS — M6281 Muscle weakness (generalized): Secondary | ICD-10-CM | POA: Diagnosis not present

## 2019-03-25 DIAGNOSIS — R278 Other lack of coordination: Secondary | ICD-10-CM | POA: Diagnosis not present

## 2019-03-25 DIAGNOSIS — M6281 Muscle weakness (generalized): Secondary | ICD-10-CM | POA: Diagnosis not present

## 2019-03-25 DIAGNOSIS — F0391 Unspecified dementia with behavioral disturbance: Secondary | ICD-10-CM | POA: Diagnosis not present

## 2019-03-25 DIAGNOSIS — F59 Unspecified behavioral syndromes associated with physiological disturbances and physical factors: Secondary | ICD-10-CM | POA: Diagnosis not present

## 2019-03-25 DIAGNOSIS — R2681 Unsteadiness on feet: Secondary | ICD-10-CM | POA: Diagnosis not present

## 2019-03-26 DIAGNOSIS — R2681 Unsteadiness on feet: Secondary | ICD-10-CM | POA: Diagnosis not present

## 2019-03-26 DIAGNOSIS — M6281 Muscle weakness (generalized): Secondary | ICD-10-CM | POA: Diagnosis not present

## 2019-03-26 DIAGNOSIS — R278 Other lack of coordination: Secondary | ICD-10-CM | POA: Diagnosis not present

## 2019-03-27 DIAGNOSIS — W19XXXA Unspecified fall, initial encounter: Secondary | ICD-10-CM | POA: Diagnosis not present

## 2019-03-27 DIAGNOSIS — S00211A Abrasion of right eyelid and periocular area, initial encounter: Secondary | ICD-10-CM | POA: Diagnosis not present

## 2019-03-27 DIAGNOSIS — R2681 Unsteadiness on feet: Secondary | ICD-10-CM | POA: Diagnosis not present

## 2019-03-27 DIAGNOSIS — R4689 Other symptoms and signs involving appearance and behavior: Secondary | ICD-10-CM | POA: Diagnosis not present

## 2019-03-27 DIAGNOSIS — F0391 Unspecified dementia with behavioral disturbance: Secondary | ICD-10-CM | POA: Diagnosis not present

## 2019-03-27 DIAGNOSIS — D508 Other iron deficiency anemias: Secondary | ICD-10-CM | POA: Diagnosis not present

## 2019-03-27 DIAGNOSIS — R278 Other lack of coordination: Secondary | ICD-10-CM | POA: Diagnosis not present

## 2019-03-27 DIAGNOSIS — M6281 Muscle weakness (generalized): Secondary | ICD-10-CM | POA: Diagnosis not present

## 2019-03-30 DIAGNOSIS — R2681 Unsteadiness on feet: Secondary | ICD-10-CM | POA: Diagnosis not present

## 2019-03-30 DIAGNOSIS — M6281 Muscle weakness (generalized): Secondary | ICD-10-CM | POA: Diagnosis not present

## 2019-03-30 DIAGNOSIS — R278 Other lack of coordination: Secondary | ICD-10-CM | POA: Diagnosis not present

## 2019-03-31 DIAGNOSIS — M6281 Muscle weakness (generalized): Secondary | ICD-10-CM | POA: Diagnosis not present

## 2019-03-31 DIAGNOSIS — R2681 Unsteadiness on feet: Secondary | ICD-10-CM | POA: Diagnosis not present

## 2019-03-31 DIAGNOSIS — R278 Other lack of coordination: Secondary | ICD-10-CM | POA: Diagnosis not present

## 2019-04-01 DIAGNOSIS — R2681 Unsteadiness on feet: Secondary | ICD-10-CM | POA: Diagnosis not present

## 2019-04-01 DIAGNOSIS — M6281 Muscle weakness (generalized): Secondary | ICD-10-CM | POA: Diagnosis not present

## 2019-04-01 DIAGNOSIS — R278 Other lack of coordination: Secondary | ICD-10-CM | POA: Diagnosis not present

## 2019-04-03 DIAGNOSIS — M6281 Muscle weakness (generalized): Secondary | ICD-10-CM | POA: Diagnosis not present

## 2019-04-03 DIAGNOSIS — R2681 Unsteadiness on feet: Secondary | ICD-10-CM | POA: Diagnosis not present

## 2019-04-03 DIAGNOSIS — R278 Other lack of coordination: Secondary | ICD-10-CM | POA: Diagnosis not present

## 2019-04-07 DIAGNOSIS — R278 Other lack of coordination: Secondary | ICD-10-CM | POA: Diagnosis not present

## 2019-04-07 DIAGNOSIS — R2681 Unsteadiness on feet: Secondary | ICD-10-CM | POA: Diagnosis not present

## 2019-04-07 DIAGNOSIS — M6281 Muscle weakness (generalized): Secondary | ICD-10-CM | POA: Diagnosis not present

## 2019-04-08 DIAGNOSIS — R2681 Unsteadiness on feet: Secondary | ICD-10-CM | POA: Diagnosis not present

## 2019-04-08 DIAGNOSIS — M6281 Muscle weakness (generalized): Secondary | ICD-10-CM | POA: Diagnosis not present

## 2019-04-08 DIAGNOSIS — R278 Other lack of coordination: Secondary | ICD-10-CM | POA: Diagnosis not present

## 2019-04-09 DIAGNOSIS — R2681 Unsteadiness on feet: Secondary | ICD-10-CM | POA: Diagnosis not present

## 2019-04-09 DIAGNOSIS — R278 Other lack of coordination: Secondary | ICD-10-CM | POA: Diagnosis not present

## 2019-04-09 DIAGNOSIS — M6281 Muscle weakness (generalized): Secondary | ICD-10-CM | POA: Diagnosis not present

## 2019-04-10 DIAGNOSIS — R278 Other lack of coordination: Secondary | ICD-10-CM | POA: Diagnosis not present

## 2019-04-10 DIAGNOSIS — M6281 Muscle weakness (generalized): Secondary | ICD-10-CM | POA: Diagnosis not present

## 2019-04-10 DIAGNOSIS — R2681 Unsteadiness on feet: Secondary | ICD-10-CM | POA: Diagnosis not present

## 2019-04-13 DIAGNOSIS — G4700 Insomnia, unspecified: Secondary | ICD-10-CM | POA: Diagnosis not present

## 2019-04-13 DIAGNOSIS — F329 Major depressive disorder, single episode, unspecified: Secondary | ICD-10-CM | POA: Diagnosis not present

## 2019-04-13 DIAGNOSIS — R278 Other lack of coordination: Secondary | ICD-10-CM | POA: Diagnosis not present

## 2019-04-13 DIAGNOSIS — G894 Chronic pain syndrome: Secondary | ICD-10-CM | POA: Diagnosis not present

## 2019-04-13 DIAGNOSIS — D508 Other iron deficiency anemias: Secondary | ICD-10-CM | POA: Diagnosis not present

## 2019-04-13 DIAGNOSIS — R2681 Unsteadiness on feet: Secondary | ICD-10-CM | POA: Diagnosis not present

## 2019-04-13 DIAGNOSIS — R4689 Other symptoms and signs involving appearance and behavior: Secondary | ICD-10-CM | POA: Diagnosis not present

## 2019-04-13 DIAGNOSIS — E039 Hypothyroidism, unspecified: Secondary | ICD-10-CM | POA: Diagnosis not present

## 2019-04-13 DIAGNOSIS — M150 Primary generalized (osteo)arthritis: Secondary | ICD-10-CM | POA: Diagnosis not present

## 2019-04-13 DIAGNOSIS — M6281 Muscle weakness (generalized): Secondary | ICD-10-CM | POA: Diagnosis not present

## 2019-04-13 DIAGNOSIS — G301 Alzheimer's disease with late onset: Secondary | ICD-10-CM | POA: Diagnosis not present

## 2019-04-14 DIAGNOSIS — M6281 Muscle weakness (generalized): Secondary | ICD-10-CM | POA: Diagnosis not present

## 2019-04-14 DIAGNOSIS — R2681 Unsteadiness on feet: Secondary | ICD-10-CM | POA: Diagnosis not present

## 2019-04-14 DIAGNOSIS — R278 Other lack of coordination: Secondary | ICD-10-CM | POA: Diagnosis not present

## 2019-04-15 DIAGNOSIS — D649 Anemia, unspecified: Secondary | ICD-10-CM | POA: Diagnosis not present

## 2019-04-15 DIAGNOSIS — Z79899 Other long term (current) drug therapy: Secondary | ICD-10-CM | POA: Diagnosis not present

## 2019-04-16 DIAGNOSIS — M6281 Muscle weakness (generalized): Secondary | ICD-10-CM | POA: Diagnosis not present

## 2019-04-16 DIAGNOSIS — R278 Other lack of coordination: Secondary | ICD-10-CM | POA: Diagnosis not present

## 2019-04-16 DIAGNOSIS — R2681 Unsteadiness on feet: Secondary | ICD-10-CM | POA: Diagnosis not present

## 2019-04-17 DIAGNOSIS — R278 Other lack of coordination: Secondary | ICD-10-CM | POA: Diagnosis not present

## 2019-04-17 DIAGNOSIS — M6281 Muscle weakness (generalized): Secondary | ICD-10-CM | POA: Diagnosis not present

## 2019-04-17 DIAGNOSIS — R2681 Unsteadiness on feet: Secondary | ICD-10-CM | POA: Diagnosis not present

## 2019-04-20 DIAGNOSIS — R278 Other lack of coordination: Secondary | ICD-10-CM | POA: Diagnosis not present

## 2019-04-20 DIAGNOSIS — M6281 Muscle weakness (generalized): Secondary | ICD-10-CM | POA: Diagnosis not present

## 2019-04-20 DIAGNOSIS — R2681 Unsteadiness on feet: Secondary | ICD-10-CM | POA: Diagnosis not present

## 2019-04-21 DIAGNOSIS — M6281 Muscle weakness (generalized): Secondary | ICD-10-CM | POA: Diagnosis not present

## 2019-04-21 DIAGNOSIS — R2681 Unsteadiness on feet: Secondary | ICD-10-CM | POA: Diagnosis not present

## 2019-04-21 DIAGNOSIS — R278 Other lack of coordination: Secondary | ICD-10-CM | POA: Diagnosis not present

## 2019-04-23 DIAGNOSIS — R269 Unspecified abnormalities of gait and mobility: Secondary | ICD-10-CM | POA: Diagnosis not present

## 2019-04-23 DIAGNOSIS — B351 Tinea unguium: Secondary | ICD-10-CM | POA: Diagnosis not present

## 2019-04-23 DIAGNOSIS — I739 Peripheral vascular disease, unspecified: Secondary | ICD-10-CM | POA: Diagnosis not present

## 2019-04-23 DIAGNOSIS — M158 Other polyosteoarthritis: Secondary | ICD-10-CM | POA: Diagnosis not present

## 2019-04-24 DIAGNOSIS — R278 Other lack of coordination: Secondary | ICD-10-CM | POA: Diagnosis not present

## 2019-04-24 DIAGNOSIS — R2681 Unsteadiness on feet: Secondary | ICD-10-CM | POA: Diagnosis not present

## 2019-04-24 DIAGNOSIS — M6281 Muscle weakness (generalized): Secondary | ICD-10-CM | POA: Diagnosis not present

## 2019-04-28 DIAGNOSIS — R278 Other lack of coordination: Secondary | ICD-10-CM | POA: Diagnosis not present

## 2019-04-28 DIAGNOSIS — R2681 Unsteadiness on feet: Secondary | ICD-10-CM | POA: Diagnosis not present

## 2019-04-28 DIAGNOSIS — M6281 Muscle weakness (generalized): Secondary | ICD-10-CM | POA: Diagnosis not present

## 2019-04-29 DIAGNOSIS — F0391 Unspecified dementia with behavioral disturbance: Secondary | ICD-10-CM | POA: Diagnosis not present

## 2019-04-29 DIAGNOSIS — M6281 Muscle weakness (generalized): Secondary | ICD-10-CM | POA: Diagnosis not present

## 2019-04-29 DIAGNOSIS — R2681 Unsteadiness on feet: Secondary | ICD-10-CM | POA: Diagnosis not present

## 2019-04-29 DIAGNOSIS — R278 Other lack of coordination: Secondary | ICD-10-CM | POA: Diagnosis not present

## 2019-04-29 DIAGNOSIS — F59 Unspecified behavioral syndromes associated with physiological disturbances and physical factors: Secondary | ICD-10-CM | POA: Diagnosis not present

## 2019-04-30 DIAGNOSIS — R278 Other lack of coordination: Secondary | ICD-10-CM | POA: Diagnosis not present

## 2019-04-30 DIAGNOSIS — R2681 Unsteadiness on feet: Secondary | ICD-10-CM | POA: Diagnosis not present

## 2019-04-30 DIAGNOSIS — M6281 Muscle weakness (generalized): Secondary | ICD-10-CM | POA: Diagnosis not present

## 2019-05-01 DIAGNOSIS — R278 Other lack of coordination: Secondary | ICD-10-CM | POA: Diagnosis not present

## 2019-05-01 DIAGNOSIS — R2681 Unsteadiness on feet: Secondary | ICD-10-CM | POA: Diagnosis not present

## 2019-05-01 DIAGNOSIS — M6281 Muscle weakness (generalized): Secondary | ICD-10-CM | POA: Diagnosis not present

## 2019-05-07 DIAGNOSIS — W19XXXA Unspecified fall, initial encounter: Secondary | ICD-10-CM | POA: Diagnosis not present

## 2019-05-07 DIAGNOSIS — Y92122 Bedroom in nursing home as the place of occurrence of the external cause: Secondary | ICD-10-CM | POA: Diagnosis not present

## 2019-05-07 DIAGNOSIS — S0181XA Laceration without foreign body of other part of head, initial encounter: Secondary | ICD-10-CM | POA: Diagnosis not present

## 2019-05-07 DIAGNOSIS — F29 Unspecified psychosis not due to a substance or known physiological condition: Secondary | ICD-10-CM | POA: Diagnosis not present

## 2019-05-07 DIAGNOSIS — Y33XXXA Other specified events, undetermined intent, initial encounter: Secondary | ICD-10-CM | POA: Diagnosis not present

## 2019-05-07 DIAGNOSIS — W01190A Fall on same level from slipping, tripping and stumbling with subsequent striking against furniture, initial encounter: Secondary | ICD-10-CM | POA: Diagnosis not present

## 2019-05-07 DIAGNOSIS — G9389 Other specified disorders of brain: Secondary | ICD-10-CM | POA: Diagnosis not present

## 2019-05-07 DIAGNOSIS — R404 Transient alteration of awareness: Secondary | ICD-10-CM | POA: Diagnosis not present

## 2019-05-07 DIAGNOSIS — S01112A Laceration without foreign body of left eyelid and periocular area, initial encounter: Secondary | ICD-10-CM | POA: Diagnosis not present

## 2019-05-07 DIAGNOSIS — S0990XA Unspecified injury of head, initial encounter: Secondary | ICD-10-CM | POA: Diagnosis not present

## 2019-05-07 DIAGNOSIS — M47812 Spondylosis without myelopathy or radiculopathy, cervical region: Secondary | ICD-10-CM | POA: Diagnosis not present

## 2019-05-07 DIAGNOSIS — S0180XA Unspecified open wound of other part of head, initial encounter: Secondary | ICD-10-CM | POA: Diagnosis not present

## 2019-05-07 DIAGNOSIS — I10 Essential (primary) hypertension: Secondary | ICD-10-CM | POA: Diagnosis not present

## 2019-05-08 DIAGNOSIS — Y33XXXA Other specified events, undetermined intent, initial encounter: Secondary | ICD-10-CM | POA: Diagnosis not present

## 2019-05-08 DIAGNOSIS — M6250 Muscle wasting and atrophy, not elsewhere classified, unspecified site: Secondary | ICD-10-CM | POA: Diagnosis not present

## 2019-05-08 DIAGNOSIS — W19XXXA Unspecified fall, initial encounter: Secondary | ICD-10-CM | POA: Diagnosis not present

## 2019-05-08 DIAGNOSIS — M47812 Spondylosis without myelopathy or radiculopathy, cervical region: Secondary | ICD-10-CM | POA: Diagnosis not present

## 2019-05-08 DIAGNOSIS — Z09 Encounter for follow-up examination after completed treatment for conditions other than malignant neoplasm: Secondary | ICD-10-CM | POA: Diagnosis not present

## 2019-05-08 DIAGNOSIS — F068 Other specified mental disorders due to known physiological condition: Secondary | ICD-10-CM | POA: Diagnosis not present

## 2019-05-08 DIAGNOSIS — F329 Major depressive disorder, single episode, unspecified: Secondary | ICD-10-CM | POA: Diagnosis not present

## 2019-05-08 DIAGNOSIS — S0990XA Unspecified injury of head, initial encounter: Secondary | ICD-10-CM | POA: Diagnosis not present

## 2019-05-08 DIAGNOSIS — R296 Repeated falls: Secondary | ICD-10-CM | POA: Diagnosis not present

## 2019-05-08 DIAGNOSIS — G4700 Insomnia, unspecified: Secondary | ICD-10-CM | POA: Diagnosis not present

## 2019-05-08 DIAGNOSIS — F0391 Unspecified dementia with behavioral disturbance: Secondary | ICD-10-CM | POA: Diagnosis not present

## 2019-05-08 DIAGNOSIS — R630 Anorexia: Secondary | ICD-10-CM | POA: Diagnosis not present

## 2019-05-08 DIAGNOSIS — Z9181 History of falling: Secondary | ICD-10-CM | POA: Diagnosis not present

## 2019-05-08 DIAGNOSIS — Z7401 Bed confinement status: Secondary | ICD-10-CM | POA: Diagnosis not present

## 2019-05-08 DIAGNOSIS — Z7689 Persons encountering health services in other specified circumstances: Secondary | ICD-10-CM | POA: Diagnosis not present

## 2019-05-08 DIAGNOSIS — W06XXXD Fall from bed, subsequent encounter: Secondary | ICD-10-CM | POA: Diagnosis not present

## 2019-05-08 DIAGNOSIS — G9389 Other specified disorders of brain: Secondary | ICD-10-CM | POA: Diagnosis not present

## 2019-05-08 DIAGNOSIS — R451 Restlessness and agitation: Secondary | ICD-10-CM | POA: Diagnosis not present

## 2019-05-15 DIAGNOSIS — R296 Repeated falls: Secondary | ICD-10-CM | POA: Diagnosis not present

## 2019-05-15 DIAGNOSIS — N390 Urinary tract infection, site not specified: Secondary | ICD-10-CM | POA: Diagnosis not present

## 2019-05-15 DIAGNOSIS — Z719 Counseling, unspecified: Secondary | ICD-10-CM | POA: Diagnosis not present

## 2019-05-15 DIAGNOSIS — R05 Cough: Secondary | ICD-10-CM | POA: Diagnosis not present

## 2019-05-16 DIAGNOSIS — R05 Cough: Secondary | ICD-10-CM | POA: Diagnosis not present

## 2019-05-17 DIAGNOSIS — R456 Violent behavior: Secondary | ICD-10-CM | POA: Diagnosis not present

## 2019-05-17 DIAGNOSIS — N390 Urinary tract infection, site not specified: Secondary | ICD-10-CM | POA: Diagnosis not present

## 2019-05-17 DIAGNOSIS — G309 Alzheimer's disease, unspecified: Secondary | ICD-10-CM | POA: Diagnosis not present

## 2019-05-17 DIAGNOSIS — I959 Hypotension, unspecified: Secondary | ICD-10-CM | POA: Diagnosis not present

## 2019-05-17 DIAGNOSIS — Z043 Encounter for examination and observation following other accident: Secondary | ICD-10-CM | POA: Diagnosis not present

## 2019-05-17 DIAGNOSIS — R918 Other nonspecific abnormal finding of lung field: Secondary | ICD-10-CM | POA: Diagnosis not present

## 2019-05-17 DIAGNOSIS — R4182 Altered mental status, unspecified: Secondary | ICD-10-CM | POA: Diagnosis not present

## 2019-05-17 DIAGNOSIS — S0990XA Unspecified injury of head, initial encounter: Secondary | ICD-10-CM | POA: Diagnosis not present

## 2019-05-17 DIAGNOSIS — F918 Other conduct disorders: Secondary | ICD-10-CM | POA: Diagnosis not present

## 2019-05-17 DIAGNOSIS — I6782 Cerebral ischemia: Secondary | ICD-10-CM | POA: Diagnosis not present

## 2019-05-17 DIAGNOSIS — W19XXXA Unspecified fall, initial encounter: Secondary | ICD-10-CM | POA: Diagnosis not present

## 2019-05-17 DIAGNOSIS — M47812 Spondylosis without myelopathy or radiculopathy, cervical region: Secondary | ICD-10-CM | POA: Diagnosis not present

## 2019-05-17 DIAGNOSIS — S79912A Unspecified injury of left hip, initial encounter: Secondary | ICD-10-CM | POA: Diagnosis not present

## 2019-05-17 DIAGNOSIS — Z20828 Contact with and (suspected) exposure to other viral communicable diseases: Secondary | ICD-10-CM | POA: Diagnosis not present

## 2019-05-17 DIAGNOSIS — F0280 Dementia in other diseases classified elsewhere without behavioral disturbance: Secondary | ICD-10-CM | POA: Diagnosis not present

## 2019-05-17 DIAGNOSIS — R404 Transient alteration of awareness: Secondary | ICD-10-CM | POA: Diagnosis not present

## 2019-05-17 DIAGNOSIS — R0902 Hypoxemia: Secondary | ICD-10-CM | POA: Diagnosis not present

## 2019-05-17 DIAGNOSIS — S8002XA Contusion of left knee, initial encounter: Secondary | ICD-10-CM | POA: Diagnosis not present

## 2019-05-17 DIAGNOSIS — Y92129 Unspecified place in nursing home as the place of occurrence of the external cause: Secondary | ICD-10-CM | POA: Diagnosis not present

## 2019-05-17 DIAGNOSIS — S199XXA Unspecified injury of neck, initial encounter: Secondary | ICD-10-CM | POA: Diagnosis not present

## 2019-05-17 DIAGNOSIS — G9389 Other specified disorders of brain: Secondary | ICD-10-CM | POA: Diagnosis not present

## 2019-05-17 DIAGNOSIS — W06XXXA Fall from bed, initial encounter: Secondary | ICD-10-CM | POA: Diagnosis not present

## 2019-05-18 DIAGNOSIS — Z7401 Bed confinement status: Secondary | ICD-10-CM | POA: Diagnosis not present

## 2019-05-18 DIAGNOSIS — R4182 Altered mental status, unspecified: Secondary | ICD-10-CM | POA: Diagnosis not present

## 2019-05-18 DIAGNOSIS — M6250 Muscle wasting and atrophy, not elsewhere classified, unspecified site: Secondary | ICD-10-CM | POA: Diagnosis not present

## 2019-05-18 DIAGNOSIS — Z9181 History of falling: Secondary | ICD-10-CM | POA: Diagnosis not present

## 2019-05-22 DIAGNOSIS — R1310 Dysphagia, unspecified: Secondary | ICD-10-CM | POA: Diagnosis not present

## 2019-05-22 DIAGNOSIS — Z09 Encounter for follow-up examination after completed treatment for conditions other than malignant neoplasm: Secondary | ICD-10-CM | POA: Diagnosis not present

## 2019-05-22 DIAGNOSIS — R296 Repeated falls: Secondary | ICD-10-CM | POA: Diagnosis not present

## 2019-05-22 DIAGNOSIS — F0391 Unspecified dementia with behavioral disturbance: Secondary | ICD-10-CM | POA: Diagnosis not present

## 2019-05-29 DIAGNOSIS — Z23 Encounter for immunization: Secondary | ICD-10-CM | POA: Diagnosis not present

## 2019-06-02 DEATH — deceased
